# Patient Record
Sex: Female | Born: 1970 | Race: Black or African American | Hispanic: No | Marital: Married | Smoking: Former smoker
Health system: Southern US, Community
[De-identification: ages and names within clinical notes are randomized; demographics above are authoritative.]

## PROBLEM LIST (undated history)

## (undated) DIAGNOSIS — F329 Major depressive disorder, single episode, unspecified: Secondary | ICD-10-CM

## (undated) DIAGNOSIS — F419 Anxiety disorder, unspecified: Secondary | ICD-10-CM

## (undated) DIAGNOSIS — T7840XA Allergy, unspecified, initial encounter: Secondary | ICD-10-CM

## (undated) DIAGNOSIS — G43909 Migraine, unspecified, not intractable, without status migrainosus: Secondary | ICD-10-CM

## (undated) DIAGNOSIS — K589 Irritable bowel syndrome without diarrhea: Secondary | ICD-10-CM

## (undated) DIAGNOSIS — F32A Depression, unspecified: Secondary | ICD-10-CM

## (undated) HISTORY — DX: Allergy, unspecified, initial encounter: T78.40XA

## (undated) HISTORY — DX: Major depressive disorder, single episode, unspecified: F32.9

## (undated) HISTORY — DX: Depression, unspecified: F32.A

## (undated) HISTORY — DX: Irritable bowel syndrome, unspecified: K58.9

## (undated) HISTORY — PX: BUNIONECTOMY: SHX129

## (undated) HISTORY — DX: Migraine, unspecified, not intractable, without status migrainosus: G43.909

## (undated) HISTORY — DX: Anxiety disorder, unspecified: F41.9

## (undated) HISTORY — PX: NO PAST SURGERIES: SHX2092

---

## 1999-09-27 ENCOUNTER — Other Ambulatory Visit: Admission: RE | Admit: 1999-09-27 | Discharge: 1999-09-27 | Payer: Self-pay | Admitting: *Deleted

## 2000-10-01 ENCOUNTER — Other Ambulatory Visit: Admission: RE | Admit: 2000-10-01 | Discharge: 2000-10-01 | Payer: Self-pay | Admitting: *Deleted

## 2001-10-03 ENCOUNTER — Other Ambulatory Visit: Admission: RE | Admit: 2001-10-03 | Discharge: 2001-10-03 | Payer: Self-pay | Admitting: Obstetrics & Gynecology

## 2002-10-06 ENCOUNTER — Other Ambulatory Visit: Admission: RE | Admit: 2002-10-06 | Discharge: 2002-10-06 | Payer: Self-pay | Admitting: Obstetrics & Gynecology

## 2003-03-29 ENCOUNTER — Other Ambulatory Visit (HOSPITAL_COMMUNITY): Admission: RE | Admit: 2003-03-29 | Discharge: 2003-04-08 | Payer: Self-pay | Admitting: Psychiatry

## 2003-05-06 ENCOUNTER — Emergency Department (HOSPITAL_COMMUNITY): Admission: EM | Admit: 2003-05-06 | Discharge: 2003-05-07 | Payer: Self-pay | Admitting: Emergency Medicine

## 2003-10-14 ENCOUNTER — Other Ambulatory Visit: Admission: RE | Admit: 2003-10-14 | Discharge: 2003-10-14 | Payer: Self-pay | Admitting: Obstetrics & Gynecology

## 2003-10-23 ENCOUNTER — Emergency Department (HOSPITAL_COMMUNITY): Admission: EM | Admit: 2003-10-23 | Discharge: 2003-10-23 | Payer: Self-pay | Admitting: Emergency Medicine

## 2004-10-24 ENCOUNTER — Other Ambulatory Visit: Admission: RE | Admit: 2004-10-24 | Discharge: 2004-10-24 | Payer: Self-pay | Admitting: Obstetrics and Gynecology

## 2005-02-16 ENCOUNTER — Ambulatory Visit: Payer: Self-pay | Admitting: Internal Medicine

## 2010-02-14 ENCOUNTER — Encounter: Admission: RE | Admit: 2010-02-14 | Discharge: 2010-02-14 | Payer: Self-pay | Admitting: Obstetrics and Gynecology

## 2011-04-19 ENCOUNTER — Emergency Department (HOSPITAL_COMMUNITY)
Admission: EM | Admit: 2011-04-19 | Discharge: 2011-04-19 | Disposition: A | Discharge status: Home / Self Care | Payer: 59 | Attending: Emergency Medicine | Admitting: Emergency Medicine

## 2011-04-19 ENCOUNTER — Emergency Department (HOSPITAL_COMMUNITY): Payer: 59

## 2011-04-19 DIAGNOSIS — N200 Calculus of kidney: Secondary | ICD-10-CM | POA: Insufficient documentation

## 2011-04-19 DIAGNOSIS — R300 Dysuria: Secondary | ICD-10-CM | POA: Insufficient documentation

## 2011-04-19 DIAGNOSIS — F3289 Other specified depressive episodes: Secondary | ICD-10-CM | POA: Insufficient documentation

## 2011-04-19 DIAGNOSIS — Z79899 Other long term (current) drug therapy: Secondary | ICD-10-CM | POA: Insufficient documentation

## 2011-04-19 DIAGNOSIS — R112 Nausea with vomiting, unspecified: Secondary | ICD-10-CM | POA: Insufficient documentation

## 2011-04-19 DIAGNOSIS — R109 Unspecified abdominal pain: Secondary | ICD-10-CM | POA: Insufficient documentation

## 2011-04-19 DIAGNOSIS — F329 Major depressive disorder, single episode, unspecified: Secondary | ICD-10-CM | POA: Insufficient documentation

## 2011-04-19 LAB — URINALYSIS, ROUTINE W REFLEX MICROSCOPIC
Bilirubin Urine: NEGATIVE
Ketones, ur: NEGATIVE mg/dL
Protein, ur: NEGATIVE mg/dL
Specific Gravity, Urine: 1.023 (ref 1.005–1.030)
Urobilinogen, UA: 0.2 mg/dL (ref 0.0–1.0)
pH: 5.5 (ref 5.0–8.0)

## 2011-04-19 LAB — POCT PREGNANCY, URINE: Preg Test, Ur: NEGATIVE

## 2011-04-19 LAB — URINE MICROSCOPIC-ADD ON

## 2011-08-01 ENCOUNTER — Ambulatory Visit (INDEPENDENT_AMBULATORY_CARE_PROVIDER_SITE_OTHER): Payer: 59 | Admitting: Family Medicine

## 2011-08-01 VITALS — BP 106/68 | HR 72 | Temp 98.8°F | Resp 16 | Ht 64.0 in | Wt 262.0 lb

## 2011-08-01 DIAGNOSIS — J019 Acute sinusitis, unspecified: Secondary | ICD-10-CM

## 2011-08-01 DIAGNOSIS — J020 Streptococcal pharyngitis: Secondary | ICD-10-CM

## 2011-08-01 DIAGNOSIS — J343 Hypertrophy of nasal turbinates: Secondary | ICD-10-CM

## 2011-08-01 DIAGNOSIS — R059 Cough, unspecified: Secondary | ICD-10-CM

## 2011-08-01 DIAGNOSIS — R05 Cough: Secondary | ICD-10-CM

## 2011-08-01 DIAGNOSIS — R5381 Other malaise: Secondary | ICD-10-CM

## 2011-08-01 MED ORDER — HYDROCODONE-HOMATROPINE 5-1.5 MG/5ML PO SYRP: 5.0000 mL | ORAL_SOLUTION | Freq: Four times a day (QID) | ORAL | Status: AC | PRN

## 2011-08-01 MED ORDER — AZITHROMYCIN 250 MG PO TABS: ORAL_TABLET | ORAL | Status: AC

## 2011-08-01 NOTE — Progress Notes (Signed)
This 41 year old woman presents with 3 days of progressive sinus congestion, cough, sore throat. Symptoms are worsening although she has not had a fever.  Object: Patient is in no acute distress vital signs are stable.  HEENT: Negative with exception mucopurulent discharge from the nose.  Neck: Supple no adenopathy no thyromegaly.  Lungs: Clear to auscultation.  Assessment: Acute sinusitis and upper respiratory symptoms.

## 2012-02-22 ENCOUNTER — Emergency Department (HOSPITAL_COMMUNITY)
Admission: EM | Admit: 2012-02-22 | Discharge: 2012-02-23 | Disposition: A | Discharge status: Home / Self Care | Payer: 59 | Attending: Emergency Medicine | Admitting: Emergency Medicine

## 2012-02-22 ENCOUNTER — Encounter (HOSPITAL_COMMUNITY): Payer: Self-pay | Admitting: *Deleted

## 2012-02-22 DIAGNOSIS — IMO0001 Reserved for inherently not codable concepts without codable children: Secondary | ICD-10-CM | POA: Insufficient documentation

## 2012-02-22 DIAGNOSIS — X58XXXA Exposure to other specified factors, initial encounter: Secondary | ICD-10-CM | POA: Insufficient documentation

## 2012-02-22 DIAGNOSIS — S161XXA Strain of muscle, fascia and tendon at neck level, initial encounter: Secondary | ICD-10-CM

## 2012-02-22 DIAGNOSIS — S139XXA Sprain of joints and ligaments of unspecified parts of neck, initial encounter: Secondary | ICD-10-CM | POA: Insufficient documentation

## 2012-02-22 DIAGNOSIS — M791 Myalgia, unspecified site: Secondary | ICD-10-CM

## 2012-02-22 LAB — BASIC METABOLIC PANEL
BUN: 11 mg/dL (ref 6–23)
Calcium: 10 mg/dL (ref 8.4–10.5)
Creatinine, Ser: 0.84 mg/dL (ref 0.50–1.10)
GFR calc Af Amer: >90 mL/min (ref >90)
Glucose, Bld: 81 mg/dL (ref 70–99)

## 2012-02-22 LAB — CBC WITH DIFFERENTIAL/PLATELET
Basophils Absolute: 0.1 10*3/uL (ref 0.0–0.1)
Eosinophils Absolute: 0.3 10*3/uL (ref 0.0–0.7)
HCT: 40.3 % (ref 36.0–46.0)
MCH: 26.4 pg (ref 26.0–34.0)
Neutrophils Relative %: 48 % (ref 43–77)
RBC: 5.04 MIL/uL (ref 3.87–5.11)

## 2012-02-22 LAB — D-DIMER, QUANTITATIVE: D-Dimer, Quant: <0.22 ug/mL-FEU (ref 0.00–0.48)

## 2012-02-22 MED ORDER — ASPIRIN 81 MG PO CHEW
324.0000 mg | CHEWABLE_TABLET | Freq: Once | ORAL | Status: AC
  Administered 2012-02-22: 324 mg via ORAL
  Filled 2012-02-22: qty 4

## 2012-02-22 NOTE — ED Notes (Signed)
Pt c/o neck pain radiating to upper back; feels like muscle spasms.  States pain in right arm and some left chest wall pain increasing over past three days

## 2012-02-22 NOTE — ED Provider Notes (Addendum)
History     CSN: [id number]  Arrival date & time 02/22/12  3086   First MD Initiated Contact with Patient 02/22/12 2222      Chief Complaint  Patient presents with  . Spasms    (Consider location/radiation/quality/duration/timing/severity/associated sxs/prior treatment) HPI Comments: Patient states, that for the past, week.  She's had neck pain, worse on the left than the right that waxed and waned in intensity.  She has not taken any over-the-counter medication.  Tonight.  She states, that she developed right arm and tingling, and left chest pain, like something was sitting on her chest.  She does report that she recently traveled to [place] by plain.  She does take hormones for birth control.  She denies any swelling in her legs or shortness of breath.   The history is provided by the patient.    History reviewed. No pertinent past medical history.  History reviewed. No pertinent past surgical history.  No family history on file.  History  Substance Use Topics  . Smoking status: Former Games developer  . Smokeless tobacco: Not on file  . Alcohol Use: No    OB History    Grav Para Term Preterm Abortions TAB SAB Ect Mult Living                  Review of Systems  Constitutional: Negative for fever and chills.  HENT: Negative for congestion.   Respiratory: Negative for shortness of breath.   Cardiovascular: Positive for chest pain. Negative for leg swelling.  Musculoskeletal: Positive for myalgias.  Neurological: Negative for dizziness and weakness.    Allergies  Review of patient's allergies indicates no known allergies.  Home Medications   Current Outpatient Rx  Name Route Sig Dispense Refill  . FLUOXETINE HCL 10 MG PO CAPS Oral Take 10 mg by mouth daily.    Janetta Hora ESTRADIOL 0.5-2.5 MG-MCG PO TABS Oral Take 1 tablet by mouth daily.    . CYCLOBENZAPRINE HCL 10 MG PO TABS Oral Take 0.5 tablets (5 mg total) by mouth 3 (three) times daily as needed for muscle  spasms. 30 tablet 0  . IBUPROFEN 600 MG PO TABS Oral Take 1 tablet (600 mg total) by mouth every 6 (six) hours as needed for pain. 30 tablet 0    BP 116/65  Pulse 80  Temp 98.2 F (36.8 C) (Oral)  Resp 15  SpO2 98%  Physical Exam  Constitutional: She is oriented to person, place, and time. She appears well-developed and well-nourished.  HENT:  Head: Normocephalic.  Eyes: Pupils are equal, round, and reactive to light.  Neck: Normal range of motion.    Cardiovascular: Normal rate.   Pulmonary/Chest: Effort normal and breath sounds normal. No respiratory distress. She has no wheezes. She exhibits no tenderness.  Abdominal: Soft. She exhibits no distension. There is no tenderness.  Musculoskeletal: Normal range of motion. She exhibits no edema and no tenderness.  Neurological: She is alert and oriented to person, place, and time.  Skin: Skin is warm.    ED Course  Procedures (including critical care time)  Labs Reviewed  BASIC METABOLIC PANEL - Abnormal; Notable for the following:    GFR calc non Af Amer 84 (*)     All other components within normal limits  CBC WITH DIFFERENTIAL  POCT I-STAT TROPONIN I  D-DIMER, QUANTITATIVE   No results found.   1. Cervical strain   2. Muscle ache     ED ECG REPORT   Date:  04/10/2012  EKG Time: 9:51 AM  Rate 79  Rhythm: normal sinus rhythm,  normal EKG, normal sinus rhythm  Axis: normal  Intervals:normal  ST&T Change: none  Narrative Interpretation: normal            MDM   Check labs, EKG, chest x-ray, and d-dimer, although I don't, think PE is high on the differential due to her change in pain, change in location, and recent travel, will check        Arman Filter, NP 02/23/12 2021  Arman Filter, NP 04/10/12 [phone]

## 2012-02-23 MED ORDER — KETOROLAC TROMETHAMINE 30 MG/ML IJ SOLN: 30.0000 mg | Freq: Once | INTRAMUSCULAR | Status: DC

## 2012-02-23 MED ORDER — KETOROLAC TROMETHAMINE 30 MG/ML IJ SOLN
30.0000 mg | Freq: Once | INTRAMUSCULAR | Status: AC
  Administered 2012-02-23: 30 mg via INTRAMUSCULAR
  Filled 2012-02-23: qty 1

## 2012-02-23 MED ORDER — IBUPROFEN 600 MG PO TABS: 600.0000 mg | ORAL_TABLET | Freq: Four times a day (QID) | ORAL | Status: AC | PRN

## 2012-02-23 MED ORDER — CYCLOBENZAPRINE HCL 10 MG PO TABS
5.0000 mg | ORAL_TABLET | Freq: Three times a day (TID) | ORAL | Status: AC | PRN
Start: 2012-02-23 — End: 2012-03-04

## 2012-02-24 NOTE — ED Provider Notes (Signed)
Medical screening examination/treatment/procedure(s) were conducted as a shared visit with non-physician practitioner(s) and myself.  I personally evaluated the patient during the encounter  Addisen Chappelle T Sherrell Farish, MD 02/24/12 2326 

## 2012-04-13 NOTE — ED Provider Notes (Signed)
Medical screening examination/treatment/procedure(s) were performed by non-physician practitioner and as supervising physician I was immediately available for consultation/collaboration.  Toy Baker, MD 04/13/12 1019

## 2012-07-24 ENCOUNTER — Ambulatory Visit (INDEPENDENT_AMBULATORY_CARE_PROVIDER_SITE_OTHER): Payer: 59 | Admitting: Family Medicine

## 2012-07-24 VITALS — BP 103/64 | HR 86 | Temp 99.5°F | Resp 16 | Ht 66.0 in | Wt 278.4 lb

## 2012-07-24 DIAGNOSIS — J069 Acute upper respiratory infection, unspecified: Secondary | ICD-10-CM

## 2012-07-24 MED ORDER — PSEUDOEPHEDRINE HCL ER 120 MG PO TB12: 120.0000 mg | ORAL_TABLET | Freq: Two times a day (BID) | ORAL | Status: DC

## 2012-07-24 MED ORDER — MUCINEX DM MAXIMUM STRENGTH 60-1200 MG PO TB12: 1.0000 | ORAL_TABLET | Freq: Two times a day (BID) | ORAL | Status: DC

## 2012-07-24 MED ORDER — HYDROCOD POLST-CHLORPHEN POLST 10-8 MG/5ML PO LQCR: 5.0000 mL | Freq: Two times a day (BID) | ORAL | Status: DC | PRN

## 2012-07-24 MED ORDER — IPRATROPIUM BROMIDE 0.03 % NA SOLN: 2.0000 | Freq: Two times a day (BID) | NASAL | Status: DC

## 2012-07-24 NOTE — Progress Notes (Signed)
Subjective:    Patient ID: Kathy Carlson, female    DOB: 1970-07-03, 42 y.o.   MRN: [id number]  HPI  Monday started coughing and yesterday started w/ HA, throat hurting, today continuing, w/ fatigue, sneezing, blowing nose.  No f/c.   Coughing occ producitve of mucous, a lot of pnd. No ear pain, sinus pressure, or teeth pain. Nml GI/GU.  Sleeping some but cough is keeping her up. Some people at work have had the flu and pt did not get flu shot this yr. No sig arthralgias/myalgias. Tried some allegra and something else w/ no sig relief.  Past Medical History  Diagnosis Date  . Migraine   . Depression    Current Outpatient Prescriptions on File Prior to Visit  Medication Sig Dispense Refill  . FLUoxetine (PROZAC) 10 MG capsule Take 10 mg by mouth daily.      . norethindrone-ethinyl estradiol (FEMHRT LOW DOSE) 0.5-2.5 MG-MCG per tablet Take 1 tablet by mouth daily.      . promethazine (PHENERGAN) 25 MG tablet Take 25 mg by mouth as needed.       No Known Allergies   Review of Systems  Constitutional: Positive for fatigue. Negative for fever, chills, diaphoresis, activity change and appetite change.  HENT: Positive for congestion, sore throat, rhinorrhea, sneezing and postnasal drip. Negative for ear pain, nosebleeds, trouble swallowing, neck pain, neck stiffness, dental problem, voice change, sinus pressure and ear discharge.   Eyes: Negative for discharge and itching.  Respiratory: Positive for cough. Negative for shortness of breath.   Cardiovascular: Negative for chest pain.  Gastrointestinal: Negative for nausea, vomiting, abdominal pain, diarrhea and constipation.  Genitourinary: Negative for dysuria, decreased urine volume and difficulty urinating.  Musculoskeletal: Negative for myalgias and arthralgias.  Skin: Negative for rash.  Neurological: Positive for headaches. Negative for dizziness and syncope.  Hematological: Negative for adenopathy.  Psychiatric/Behavioral: Positive  for sleep disturbance.      BP 103/64  Pulse 86  Temp 99.5 F (37.5 C) (Oral)  Resp 16  Ht 5\' 6"  (1.676 m)  Wt 278 lb 6.4 oz (126.281 kg)  BMI 44.93 kg/m2  SpO2 100% Objective:   Physical Exam  Constitutional: She is oriented to person, place, and time. She appears well-developed and well-nourished. No distress.  HENT:  Head: Normocephalic and atraumatic.  Right Ear: External ear and ear canal normal. Tympanic membrane is erythematous. Tympanic membrane is not retracted and not bulging.  Left Ear: Tympanic membrane, external ear and ear canal normal. Tympanic membrane is not retracted and not bulging.  Nose: Mucosal edema and rhinorrhea present.  Mouth/Throat: Uvula is midline, oropharynx is clear and moist and mucous membranes are normal. No oropharyngeal exudate.  Eyes: Conjunctivae normal are normal. Right eye exhibits no discharge. Left eye exhibits no discharge. No scleral icterus.  Neck: Normal range of motion. Neck supple.  Cardiovascular: Normal rate, regular rhythm, normal heart sounds and intact distal pulses.   Pulmonary/Chest: Effort normal and breath sounds normal.  Abdominal: She exhibits no mass.  Lymphadenopathy:       Head (right side): Submandibular adenopathy present.       Head (left side): Submandibular adenopathy present.    She has cervical adenopathy.       Right cervical: Superficial cervical adenopathy present.       Right: No supraclavicular adenopathy present.       Left: No supraclavicular adenopathy present.  Neurological: She is alert and oriented to person, place, and time.  Skin: Skin is  warm and dry. She is not diaphoretic. No erythema.  Psychiatric: She has a normal mood and affect. Her behavior is normal.      Assessment & Plan:   1. URI (upper respiratory infection)  chlorpheniramine-HYDROcodone (TUSSIONEX PENNKINETIC ER) 10-8 MG/5ML LQCR, ipratropium (ATROVENT) 0.03 % nasal spray, Dextromethorphan-Guaifenesin (MUCINEX DM MAXIMUM STRENGTH)  60-1200 MG TB12, pseudoephedrine (SUDAFED 12 HOUR) 120 MG 12 hr tablet  Rec rest and symptomatic care. RTC if worsening Meds ordered this encounter  Medications  .       .       . chlorpheniramine-HYDROcodone (TUSSIONEX PENNKINETIC ER) 10-8 MG/5ML LQCR    Sig: Take 5 mLs by mouth every 12 (twelve) hours as needed (cough).    Dispense:  120 mL    Refill:  0  . ipratropium (ATROVENT) 0.03 % nasal spray    Sig: Place 2 sprays into the nose 2 (two) times daily.    Dispense:  30 mL    Refill:  1  . Dextromethorphan-Guaifenesin (MUCINEX DM MAXIMUM STRENGTH) 60-1200 MG TB12    Sig: Take 1 tablet by mouth every 12 (twelve) hours.    Dispense:  28 each    Refill:  0  . pseudoephedrine (SUDAFED 12 HOUR) 120 MG 12 hr tablet    Sig: Take 1 tablet (120 mg total) by mouth every 12 (twelve) hours.    Dispense:  14 tablet    Refill:  0   I recommend frequent warm salt water gargles, hot tea with honey and lemon, rest, and handwashing.  Hot showers or breathing in steam may help loosen the congestion.  Try a netti pot or sinus rinse is also likely to help you feel better and keep this from progressing.

## 2012-07-24 NOTE — Patient Instructions (Addendum)
Sinusitis Sinusitis is redness, soreness, and swelling (inflammation) of the paranasal sinuses. Paranasal sinuses are air pockets within the bones of your face (beneath the eyes, the middle of the forehead, or above the eyes). In healthy paranasal sinuses, mucus is able to drain out, and air is able to circulate through them by way of your nose. However, when your paranasal sinuses are inflamed, mucus and air can become trapped. This can allow bacteria and other germs to grow and cause infection. Sinusitis can develop quickly and last only a short time (acute) or continue over a long period (chronic). Sinusitis that lasts for more than 12 weeks is considered chronic.  CAUSES  Causes of sinusitis include:  Allergies.  Structural abnormalities, such as displacement of the cartilage that separates your nostrils (deviated septum), which can decrease the air flow through your nose and sinuses and affect sinus drainage.  Functional abnormalities, such as when the small hairs (cilia) that line your sinuses and help remove mucus do not work properly or are not present. SYMPTOMS  Symptoms of acute and chronic sinusitis are the same. The primary symptoms are pain and pressure around the affected sinuses. Other symptoms include:  Upper toothache.  Earache.  Headache.  Bad breath.  Decreased sense of smell and taste.  A cough, which worsens when you are lying flat.  Fatigue.  Fever.  Thick drainage from your nose, which often is green and may contain pus (purulent).  Swelling and warmth over the affected sinuses. DIAGNOSIS  Your caregiver will perform a physical exam. During the exam, your caregiver may:  Look in your nose for signs of abnormal growths in your nostrils (nasal polyps).  Tap over the affected sinus to check for signs of infection.  View the inside of your sinuses (endoscopy) with a special imaging device with a light attached (endoscope), which is inserted into your  sinuses. If your caregiver suspects that you have chronic sinusitis, one or more of the following tests may be recommended:  Allergy tests.  Nasal culture A sample of mucus is taken from your nose and sent to a lab and screened for bacteria.  Nasal cytology A sample of mucus is taken from your nose and examined by your caregiver to determine if your sinusitis is related to an allergy. TREATMENT  Most cases of acute sinusitis are related to a viral infection and will resolve on their own within 10 days. Sometimes medicines are prescribed to help relieve symptoms (pain medicine, decongestants, nasal steroid sprays, or saline sprays).  However, for sinusitis related to a bacterial infection, your caregiver will prescribe antibiotic medicines. These are medicines that will help kill the bacteria causing the infection.  Rarely, sinusitis is caused by a fungal infection. In theses cases, your caregiver will prescribe antifungal medicine. For some cases of chronic sinusitis, surgery is needed. Generally, these are cases in which sinusitis recurs more than 3 times per year, despite other treatments. HOME CARE INSTRUCTIONS   Drink plenty of water. Water helps thin the mucus so your sinuses can drain more easily.  Use a humidifier.  Inhale steam 3 to 4 times a day (for example, sit in the bathroom with the shower running).  Apply a warm, moist washcloth to your face 3 to 4 times a day, or as directed by your caregiver.  Use saline nasal sprays to help moisten and clean your sinuses.  Take over-the-counter or prescription medicines for pain, discomfort, or fever only as directed by your caregiver. SEEK IMMEDIATE MEDICAL   CARE IF:  You have increasing pain or severe headaches.  You have nausea, vomiting, or drowsiness.  You have swelling around your face.  You have vision problems.  You have a stiff neck.  You have difficulty breathing. MAKE SURE YOU:   Understand these  instructions.  Will watch your condition.  Will get help right away if you are not doing well or get worse. Document Released: 06/11/2005 Document Revised: 09/03/2011 Document Reviewed: 06/26/2011 [facility] Patient Information 2013 [place], [place].   Upper Respiratory Infection, Adult An upper respiratory infection (URI) is also known as the common cold. It is often caused by a type of germ (virus). Colds are easily spread (contagious). You can pass it to others by kissing, coughing, sneezing, or drinking out of the same glass. Usually, you get better in 1 or 2 weeks.  HOME CARE   Only take medicine as told by your doctor.  Use a warm mist humidifier or breathe in steam from a hot shower.  Drink enough water and fluids to keep your pee (urine) clear or pale yellow.  Get plenty of rest.  Return to work when your temperature is back to normal or as told by your doctor. You may use a face mask and wash your hands to stop your cold from spreading. GET HELP RIGHT AWAY IF:   After the first few days, you feel you are getting worse.  You have questions about your medicine.  You have chills, shortness of breath, or brown or red spit (mucus).  You have yellow or brown snot (nasal discharge) or pain in the face, especially when you bend forward.  You have a fever, puffy (swollen) neck, pain when you swallow, or white spots in the back of your throat.  You have a bad headache, ear pain, sinus pain, or chest pain.  You have a high-pitched whistling sound when you breathe in and out (wheezing).  You have a lasting cough or cough up blood.  You have sore muscles or a stiff neck. MAKE SURE YOU:   Understand these instructions.  Will watch your condition.  Will get help right away if you are not doing well or get worse. Document Released: 11/28/2007 Document Revised: 09/03/2011 Document Reviewed: 10/16/2010 [facility] Patient Information 2013 [place], [place].

## 2012-12-09 ENCOUNTER — Other Ambulatory Visit: Payer: Self-pay | Admitting: Obstetrics and Gynecology

## 2012-12-09 DIAGNOSIS — R928 Other abnormal and inconclusive findings on diagnostic imaging of breast: Secondary | ICD-10-CM

## 2012-12-17 ENCOUNTER — Ambulatory Visit
Admission: RE | Admit: 2012-12-17 | Discharge: 2012-12-17 | Disposition: A | Discharge status: Home / Self Care | Payer: 59 | Source: Ambulatory Visit | Attending: Obstetrics and Gynecology | Admitting: Obstetrics and Gynecology

## 2012-12-17 DIAGNOSIS — R928 Other abnormal and inconclusive findings on diagnostic imaging of breast: Secondary | ICD-10-CM

## 2012-12-19 ENCOUNTER — Ambulatory Visit (INDEPENDENT_AMBULATORY_CARE_PROVIDER_SITE_OTHER): Payer: 59 | Admitting: Physician Assistant

## 2012-12-19 VITALS — BP 134/82 | HR 67 | Temp 98.8°F | Resp 16 | Ht 65.0 in | Wt 266.8 lb

## 2012-12-19 DIAGNOSIS — IMO0002 Reserved for concepts with insufficient information to code with codable children: Secondary | ICD-10-CM

## 2012-12-19 DIAGNOSIS — L03012 Cellulitis of left finger: Secondary | ICD-10-CM

## 2012-12-19 MED ORDER — DOXYCYCLINE HYCLATE 100 MG PO CAPS: 100.0000 mg | ORAL_CAPSULE | Freq: Two times a day (BID) | ORAL | Status: DC

## 2012-12-19 NOTE — Progress Notes (Signed)
  Subjective:    Patient ID: Kathy Carlson, female    DOB: 07-24-1970, 42 y.o.   MRN: [id number]  HPI 42 year old female presents with 1 week history of left thumb lateral nail fold redness and pain.  States symptoms have been slowly worsening and becoming more painful so he decided to come in for evaluation.  Denies drainage from the area. No known trauma or hangnail but she has been clipping back the skin since the pain started.  Hit finger yesterday and noticed a bit of blood but no purulence.  No fevers, chills, nausea, or vomiting.  She does admit that the area feels warm and is throbbing slightly.   Patient otherwise doing well with no other concerns today.     Review of Systems  Constitutional: Negative for fever and chills.  Gastrointestinal: Negative for nausea and vomiting.  Skin: Positive for color change.  Neurological: Negative for headaches.       Objective:   Physical Exam  Constitutional: She is oriented to person, place, and time. She appears well-developed and well-nourished.  HENT:  Head: Normocephalic and atraumatic.  Right Ear: External ear normal.  Left Ear: External ear normal.  Eyes: Conjunctivae are normal.  Neck: Normal range of motion.  Cardiovascular: Normal rate.   Pulmonary/Chest: Effort normal.  Neurological: She is alert and oriented to person, place, and time.  Skin:  Left thumb lateral nail fold slightly erythematous and indurated. No fluctuance or purulent drainage.   Psychiatric: She has a normal mood and affect. Her behavior is normal. Judgment and thought content normal.          Assessment & Plan:  Paronychia, left - Plan: doxycycline (VIBRAMYCIN) 100 MG capsule  Early paronychia - no I&D necessary today Recommend warm, soapy soaks 2-3 times daily  Start doxycycline 100 mg bid x 10 days RTC if area becoming more red or fluctuant or if she develops fever or chills

## 2013-01-05 ENCOUNTER — Ambulatory Visit (INDEPENDENT_AMBULATORY_CARE_PROVIDER_SITE_OTHER): Payer: 59 | Admitting: Emergency Medicine

## 2013-01-05 VITALS — BP 124/80 | HR 82 | Temp 98.4°F | Resp 16 | Ht 64.5 in | Wt 285.2 lb

## 2013-01-05 DIAGNOSIS — J018 Other acute sinusitis: Secondary | ICD-10-CM

## 2013-01-05 DIAGNOSIS — J209 Acute bronchitis, unspecified: Secondary | ICD-10-CM

## 2013-01-05 MED ORDER — GUAIFENESIN-DM 100-10 MG/5ML PO SYRP: 5.0000 mL | ORAL_SOLUTION | ORAL | Status: DC | PRN

## 2013-01-05 MED ORDER — PSEUDOEPHEDRINE-GUAIFENESIN ER 60-600 MG PO TB12: 1.0000 | ORAL_TABLET | Freq: Two times a day (BID) | ORAL | Status: AC

## 2013-01-05 MED ORDER — AMOXICILLIN-POT CLAVULANATE 875-125 MG PO TABS: 1.0000 | ORAL_TABLET | Freq: Two times a day (BID) | ORAL | Status: DC

## 2013-01-05 NOTE — Progress Notes (Signed)
[facility] [address] [place] [address] [phone]  Date:  01/05/2013   Name:  Kathy Carlson   DOB:  [birth year]   MRN:  [id number]  PCP:  No PCP Per Patient    Chief Complaint: Cough and Sore Throat   History of Present Illness:  Kathy Carlson is a 42 y.o. very pleasant female patient who presents with the following:  Ill for over a week with nasal congestion, achy feeling.   Has purulent nasal drainage and post nasal drip and a mostly non productive cough.  No fever or chills,  No wheezing or shortness of breath.  No nausea or vomiting.  No stool change.  History of seasonal allergic rhinitis.  No improvement with over the counter medications or other home remedies. Denies other complaint or health concern today.   There are no active problems to display for this patient.   Past Medical History  Diagnosis Date  . Migraine   . Depression     No past surgical history on file.  History  Substance Use Topics  . Smoking status: Former Games developer  . Smokeless tobacco: Not on file  . Alcohol Use: No    Family History  Problem Relation Age of Onset  . Thyroid disease Mother   . Cancer Maternal Grandmother   . Hypertension Maternal Grandmother   . Thyroid disease Sister   . Hypertension Maternal Grandfather     No Known Allergies  Medication list has been reviewed and updated.  Current Outpatient Prescriptions on File Prior to Visit  Medication Sig Dispense Refill  . ketoprofen (ORUDIS) 75 MG capsule Take 75 mg by mouth as needed.      . norethindrone-ethinyl estradiol (FEMHRT LOW DOSE) 0.5-2.5 MG-MCG per tablet Take 1 tablet by mouth daily.      Marland Kitchen doxycycline (VIBRAMYCIN) 100 MG capsule Take 1 capsule (100 mg total) by mouth 2 (two) times daily.  20 capsule  0   No current facility-administered medications on file prior to visit.    Review of Systems:  As per HPI, otherwise negative.    Physical Examination: Filed Vitals:   01/05/13 1826  BP: 124/80  Pulse: 82  Temp: 98.4 F (36.9 C)  Resp: 16   Filed Vitals:   01/05/13 1826  Height: 5' 4.5" (1.638 m)  Weight: 285 lb 3.2 oz (129.366 kg)   Body mass index is 48.22 kg/(m^2). Ideal Body Weight: Weight in (lb) to have BMI = 25: 147.6  GEN: WDWN, NAD, Non-toxic, A & O x 3 HEENT: Atraumatic, Normocephalic. Neck supple. No masses, No LAD. Ears and Nose: No external deformity. CV: RRR, No M/G/R. No JVD. No thrill. No extra heart sounds. PULM: CTA B, no wheezes, crackles, rhonchi. No retractions. No resp. distress. No accessory muscle use. ABD: S, NT, ND, +BS. No rebound. No HSM. EXTR: No c/c/e NEURO Normal gait.  PSYCH: Normally interactive. Conversant. Not depressed or anxious appearing.  Calm demeanor.    Assessment and Plan: Sinusitis Bronchitis augmentin mucinex d Robitussin dm   Signed,  Phillips Odor, MD

## 2013-01-05 NOTE — Patient Instructions (Signed)

## 2013-08-21 ENCOUNTER — Other Ambulatory Visit: Payer: Self-pay | Admitting: Otolaryngology

## 2013-08-21 DIAGNOSIS — H9319 Tinnitus, unspecified ear: Secondary | ICD-10-CM

## 2013-09-01 ENCOUNTER — Other Ambulatory Visit: Payer: 59

## 2013-09-03 ENCOUNTER — Other Ambulatory Visit: Payer: 59

## 2013-09-08 ENCOUNTER — Ambulatory Visit
Admission: RE | Admit: 2013-09-08 | Discharge: 2013-09-08 | Disposition: A | Discharge status: Home / Self Care | Payer: 59 | Source: Ambulatory Visit | Attending: Otolaryngology | Admitting: Otolaryngology

## 2013-09-08 DIAGNOSIS — H9319 Tinnitus, unspecified ear: Secondary | ICD-10-CM

## 2013-09-11 ENCOUNTER — Telehealth (HOSPITAL_COMMUNITY): Payer: Self-pay | Admitting: Interventional Radiology

## 2013-09-11 ENCOUNTER — Other Ambulatory Visit (HOSPITAL_COMMUNITY): Payer: Self-pay | Admitting: Otolaryngology

## 2013-09-11 DIAGNOSIS — H9319 Tinnitus, unspecified ear: Secondary | ICD-10-CM

## 2013-09-11 NOTE — Telephone Encounter (Signed)
Called pt, left VM for her to call to set up consult with Deveshwar JM

## 2013-09-23 ENCOUNTER — Ambulatory Visit (HOSPITAL_COMMUNITY)
Admission: RE | Admit: 2013-09-23 | Discharge: 2013-09-23 | Disposition: A | Discharge status: Home / Self Care | Payer: 59 | Source: Ambulatory Visit | Attending: Otolaryngology | Admitting: Otolaryngology

## 2013-09-23 DIAGNOSIS — H9319 Tinnitus, unspecified ear: Secondary | ICD-10-CM

## 2014-10-05 ENCOUNTER — Ambulatory Visit (INDEPENDENT_AMBULATORY_CARE_PROVIDER_SITE_OTHER): Payer: 59

## 2014-10-05 ENCOUNTER — Ambulatory Visit (INDEPENDENT_AMBULATORY_CARE_PROVIDER_SITE_OTHER): Payer: 59 | Admitting: Emergency Medicine

## 2014-10-05 VITALS — BP 108/66 | HR 69 | Temp 98.0°F | Resp 16 | Ht 64.5 in | Wt 284.0 lb

## 2014-10-05 DIAGNOSIS — M5431 Sciatica, right side: Secondary | ICD-10-CM

## 2014-10-05 MED ORDER — NAPROXEN SODIUM 550 MG PO TABS: 550.0000 mg | ORAL_TABLET | Freq: Two times a day (BID) | ORAL | Status: DC

## 2014-10-05 MED ORDER — HYDROCODONE-ACETAMINOPHEN 5-325 MG PO TABS: 1.0000 | ORAL_TABLET | ORAL | Status: DC | PRN

## 2014-10-05 NOTE — Progress Notes (Deleted)
   Subjective:    Patient ID: Kathy Carlson, female    DOB: 12/23/1970, 44 y.o.   MRN: [id number]  HPI    Review of Systems     Objective:   Physical Exam        Assessment & Plan:

## 2014-10-05 NOTE — Patient Instructions (Signed)
Sciatica Sciatica is pain, weakness, numbness, or tingling along the path of the sciatic nerve. The nerve starts in the lower back and runs down the back of each leg. The nerve controls the muscles in the lower leg and in the back of the knee, while also providing sensation to the back of the thigh, lower leg, and the sole of your foot. Sciatica is a symptom of another medical condition. For instance, nerve damage or certain conditions, such as a herniated disk or bone spur on the spine, pinch or put pressure on the sciatic nerve. This causes the pain, weakness, or other sensations normally associated with sciatica. Generally, sciatica only affects one side of the body. CAUSES   Herniated or slipped disc.  Degenerative disk disease.  A pain disorder involving the narrow muscle in the buttocks (piriformis syndrome).  Pelvic injury or fracture.  Pregnancy.  Tumor (rare). SYMPTOMS  Symptoms can vary from mild to very severe. The symptoms usually travel from the low back to the buttocks and down the back of the leg. Symptoms can include:  Mild tingling or dull aches in the lower back, leg, or hip.  Numbness in the back of the calf or sole of the foot.  Burning sensations in the lower back, leg, or hip.  Sharp pains in the lower back, leg, or hip.  Leg weakness.  Severe back pain inhibiting movement. These symptoms may get worse with coughing, sneezing, laughing, or prolonged sitting or standing. Also, being overweight may worsen symptoms. DIAGNOSIS  Your caregiver will perform a physical exam to look for common symptoms of sciatica. He or she may ask you to do certain movements or activities that would trigger sciatic nerve pain. Other tests may be performed to find the cause of the sciatica. These may include:  Blood tests.  X-rays.  Imaging tests, such as an MRI or CT scan. TREATMENT  Treatment is directed at the cause of the sciatic pain. Sometimes, treatment is not necessary  and the pain and discomfort goes away on its own. If treatment is needed, your caregiver may suggest:  Over-the-counter medicines to relieve pain.  Prescription medicines, such as anti-inflammatory medicine, muscle relaxants, or narcotics.  Applying heat or ice to the painful area.  Steroid injections to lessen pain, irritation, and inflammation around the nerve.  Reducing activity during periods of pain.  Exercising and stretching to strengthen your abdomen and improve flexibility of your spine. Your caregiver may suggest losing weight if the extra weight makes the back pain worse.  Physical therapy.  Surgery to eliminate what is pressing or pinching the nerve, such as a bone spur or part of a herniated disk. HOME CARE INSTRUCTIONS   Only take over-the-counter or prescription medicines for pain or discomfort as directed by your caregiver.  Apply ice to the affected area for 20 minutes, 3-4 times a day for the first 48-72 hours. Then try heat in the same way.  Exercise, stretch, or perform your usual activities if these do not aggravate your pain.  Attend physical therapy sessions as directed by your caregiver.  Keep all follow-up appointments as directed by your caregiver.  Do not wear high heels or shoes that do not provide proper support.  Check your mattress to see if it is too soft. A firm mattress may lessen your pain and discomfort. SEEK IMMEDIATE MEDICAL CARE IF:   You lose control of your bowel or bladder (incontinence).  You have increasing weakness in the lower back, pelvis, buttocks,   or legs.  You have redness or swelling of your back.  You have a burning sensation when you urinate.  You have pain that gets worse when you lie down or awakens you at night.  Your pain is worse than you have experienced in the past.  Your pain is lasting longer than 4 weeks.  You are suddenly losing weight without reason. MAKE SURE YOU:  Understand these  instructions.  Will watch your condition.  Will get help right away if you are not doing well or get worse. Document Released: 06/05/2001 Document Revised: 12/11/2011 Document Reviewed: 10/21/2011 [employer] Patient Information 2015 [employer]. This information is not intended to replace advice given to you by your health care provider. Make sure you discuss any questions you have with your health care provider.  

## 2014-10-05 NOTE — Progress Notes (Signed)
[facility] [address], [place] [id number] [phone]  Date:  10/05/2014   Name:  Kathy Carlson   DOB:  05-05-1971   MRN:  [id number]  PCP:  No PCP Per Patient    Chief Complaint: Hip Pain   History of Present Illness:  Kathy Carlson is a 44 y.o. very pleasant female patient who presents with the following:  Patient was helping her husband move a mattress and lost her balance and fell balance and fell backward and fell to the ground This occurred in February. Since, she has used OTC medication with no meaningful change The pain is located in her right hip and radiates distally to the lateral thigh. Intermittently radiates to the lateral lower leg. No neuro symptoms. Never treated before today Says pain is not affected by posture, position or activity Pain only diminished by medication. No improvement with over the counter medications or other home remedies.  Denies other complaint or health concern today.   There are no active problems to display for this patient.   Past Medical History  Diagnosis Date  . Migraine   . Depression   . Allergy   . Anxiety     History reviewed. No pertinent past surgical history.  History  Substance Use Topics  . Smoking status: Former Games developermoker  . Smokeless tobacco: Not on file  . Alcohol Use: No    Family History  Problem Relation Age of Onset  . Thyroid disease Mother   . Cancer Maternal Grandmother   . Hypertension Maternal Grandmother   . Thyroid disease Sister   . Hypertension Maternal Grandfather   . Diabetes Father     No Known Allergies  Medication list has been reviewed and updated.  No current outpatient prescriptions on file prior to visit.   No current facility-administered medications on file prior to visit.    Review of Systems:  As per HPI, otherwise negative.    Physical Examination: Filed Vitals:   10/05/14 1425  BP: 108/66  Pulse: 69  Temp: 98 F (36.7 C)  Resp:  16   Filed Vitals:   10/05/14 1425  Height: 5' 4.5" (1.638 m)  Weight: 284 lb (128.822 kg)   Body mass index is 48.01 kg/(m^2). Ideal Body Weight: Weight in (lb) to have BMI = 25: 147.6   GEN: morbid obesity, NAD, Non-toxic, Alert & Oriented x 3 HEENT: Atraumatic, Normocephalic.  Ears and Nose: No external deformity. EXTR: No clubbing/cyanosis/edema NEURO: Normal gait.  Neuro and motor leg ntact PSYCH: Normally interactive. Conversant. Not depressed or anxious appearing.  Calm demeanor.  Back :  Tender right sacral region and sciatic notch  Assessment and Plan: Sciatic neuritis Anaprox Consider magnet in two weeks if not improved  Signed,  Phillips OdorJeffery Tej Murdaugh, MD   [facility] reading (PRIMARY) by  Dr. Dareen PianoAnderson.  unremarkable.

## 2014-10-25 ENCOUNTER — Ambulatory Visit (INDEPENDENT_AMBULATORY_CARE_PROVIDER_SITE_OTHER): Payer: 59 | Admitting: Emergency Medicine

## 2014-10-25 VITALS — BP 112/80 | HR 65 | Temp 98.3°F | Resp 20 | Ht 64.5 in | Wt 286.6 lb

## 2014-10-25 DIAGNOSIS — M5431 Sciatica, right side: Secondary | ICD-10-CM

## 2014-10-25 MED ORDER — LORAZEPAM 2 MG PO TABS: ORAL_TABLET | ORAL | Status: DC

## 2014-10-25 NOTE — Patient Instructions (Signed)
Sciatica Sciatica is pain, weakness, numbness, or tingling along the path of the sciatic nerve. The nerve starts in the lower back and runs down the back of each leg. The nerve controls the muscles in the lower leg and in the back of the knee, while also providing sensation to the back of the thigh, lower leg, and the sole of your foot. Sciatica is a symptom of another medical condition. For instance, nerve damage or certain conditions, such as a herniated disk or bone spur on the spine, pinch or put pressure on the sciatic nerve. This causes the pain, weakness, or other sensations normally associated with sciatica. Generally, sciatica only affects one side of the body. CAUSES   Herniated or slipped disc.  Degenerative disk disease.  A pain disorder involving the narrow muscle in the buttocks (piriformis syndrome).  Pelvic injury or fracture.  Pregnancy.  Tumor (rare). SYMPTOMS  Symptoms can vary from mild to very severe. The symptoms usually travel from the low back to the buttocks and down the back of the leg. Symptoms can include:  Mild tingling or dull aches in the lower back, leg, or hip.  Numbness in the back of the calf or sole of the foot.  Burning sensations in the lower back, leg, or hip.  Sharp pains in the lower back, leg, or hip.  Leg weakness.  Severe back pain inhibiting movement. These symptoms may get worse with coughing, sneezing, laughing, or prolonged sitting or standing. Also, being overweight may worsen symptoms. DIAGNOSIS  Your caregiver will perform a physical exam to look for common symptoms of sciatica. He or she may ask you to do certain movements or activities that would trigger sciatic nerve pain. Other tests may be performed to find the cause of the sciatica. These may include:  Blood tests.  X-rays.  Imaging tests, such as an MRI or CT scan. TREATMENT  Treatment is directed at the cause of the sciatic pain. Sometimes, treatment is not necessary  and the pain and discomfort goes away on its own. If treatment is needed, your caregiver may suggest:  Over-the-counter medicines to relieve pain.  Prescription medicines, such as anti-inflammatory medicine, muscle relaxants, or narcotics.  Applying heat or ice to the painful area.  Steroid injections to lessen pain, irritation, and inflammation around the nerve.  Reducing activity during periods of pain.  Exercising and stretching to strengthen your abdomen and improve flexibility of your spine. Your caregiver may suggest losing weight if the extra weight makes the back pain worse.  Physical therapy.  Surgery to eliminate what is pressing or pinching the nerve, such as a bone spur or part of a herniated disk. HOME CARE INSTRUCTIONS   Only take over-the-counter or prescription medicines for pain or discomfort as directed by your caregiver.  Apply ice to the affected area for 20 minutes, 3-4 times a day for the first 48-72 hours. Then try heat in the same way.  Exercise, stretch, or perform your usual activities if these do not aggravate your pain.  Attend physical therapy sessions as directed by your caregiver.  Keep all follow-up appointments as directed by your caregiver.  Do not wear high heels or shoes that do not provide proper support.  Check your mattress to see if it is too soft. A firm mattress may lessen your pain and discomfort. SEEK IMMEDIATE MEDICAL CARE IF:   You lose control of your bowel or bladder (incontinence).  You have increasing weakness in the lower back, pelvis, buttocks,   or legs.  You have redness or swelling of your back.  You have a burning sensation when you urinate.  You have pain that gets worse when you lie down or awakens you at night.  Your pain is worse than you have experienced in the past.  Your pain is lasting longer than 4 weeks.  You are suddenly losing weight without reason. MAKE SURE YOU:  Understand these  instructions.  Will watch your condition.  Will get help right away if you are not doing well or get worse. Document Released: 06/05/2001 Document Revised: 12/11/2011 Document Reviewed: 10/21/2011 [employer] Patient Information 2015 [employer]. This information is not intended to replace advice given to you by your health care provider. Make sure you discuss any questions you have with your health care provider.  

## 2014-10-25 NOTE — Progress Notes (Signed)
[facility] [address] [place] [address] [phone]  Date:  10/25/2014   Name:  Kathy Carlson   DOB:  [birth year]   MRN:  [id number]  PCP:  No PCP Per Patient    Chief Complaint: Follow-up and Leg Pain   History of Present Illness:  Kathy Carlson is a 44 y.o. very pleasant female patient who presents with the following:  Seen 4/12 following a fall.  Experienced low back pain following with radicular pain in right leg. Has interval continuation of pain with no weakness or paresthesias in leg No reinjury. No improvement with over the counter medications or other home remedies. Denies other complaint or health concern today.   There are no active problems to display for this patient.   Past Medical History  Diagnosis Date  . Migraine   . Depression   . Allergy   . Anxiety     History reviewed. No pertinent past surgical history.  History  Substance Use Topics  . Smoking status: Former Games developer  . Smokeless tobacco: Not on file  . Alcohol Use: No    Family History  Problem Relation Age of Onset  . Thyroid disease Mother   . Cancer Maternal Grandmother   . Hypertension Maternal Grandmother   . Thyroid disease Sister   . Hypertension Maternal Grandfather   . Diabetes Father     No Known Allergies  Medication list has been reviewed and updated.  Current Outpatient Prescriptions on File Prior to Visit  Medication Sig Dispense Refill  . FLUoxetine (PROZAC) 10 MG capsule Take 10 mg by mouth daily.    Marland Kitchen HYDROcodone-acetaminophen (NORCO) 5-325 MG per tablet Take 1-2 tablets by mouth every 4 (four) hours as needed. 30 tablet 0  . nabumetone (RELAFEN) 500 MG tablet Take 500 mg by mouth daily.    . naproxen sodium (ANAPROX DS) 550 MG tablet Take 1 tablet (550 mg total) by mouth 2 (two) times daily with a meal. 40 tablet 0  . promethazine (PHENERGAN) 25 MG tablet Take 25 mg by mouth every 6 (six) hours as needed for nausea or vomiting.      No current facility-administered medications on file prior to visit.    Review of Systems:  Review of Systems  Constitutional: Negative for fever, chills and fatigue.  HENT: Negative for congestion, ear pain, hearing loss, postnasal drip, rhinorrhea and sinus pressure.   Eyes: Negative for discharge and redness.  Respiratory: Negative for cough, shortness of breath and wheezing.   Cardiovascular: Negative for chest pain and leg swelling.  Gastrointestinal: Negative for nausea, vomiting, abdominal pain, constipation and blood in stool.  Genitourinary: Negative for dysuria, urgency and frequency.  Musculoskeletal: Negative for neck stiffness.  Skin: Negative for rash.  Neurological: Negative for seizures, weakness and headaches.     Physical Examination: Filed Vitals:   10/25/14 1946  BP: 112/80  Pulse: 65  Temp: 98.3 F (36.8 C)  Resp: 20   Filed Vitals:   10/25/14 1946  Height: 5' 4.5" (1.638 m)  Weight: 286 lb 9.6 oz (130.001 kg)   Body mass index is 48.45 kg/(m^2). Ideal Body Weight: Weight in (lb) to have BMI = 25: 147.6   GEN: WDWN, NAD, Non-toxic, Alert & Oriented x 3 HEENT: Atraumatic, Normocephalic.  Ears and Nose: No external deformity. EXTR: No clubbing/cyanosis/edema NEURO: Normal gait.  PSYCH: Normally interactive. Conversant. Not depressed or anxious appearing.  Calm demeanor.  BACK  Tender sciatic notch. Normal neuro exam  and motor   Assessment and Plan: Sciatic neuritis Continue meds MRI   Signed Phillips OdorJeffery Anderson, MD

## 2014-10-26 ENCOUNTER — Other Ambulatory Visit: Payer: Self-pay | Admitting: Emergency Medicine

## 2014-10-26 ENCOUNTER — Telehealth: Payer: Self-pay

## 2014-10-26 NOTE — Telephone Encounter (Signed)
Patient says Dr Dareen PianoAnderson forgot to call in her hydrocodone and naproxen rx from her most recent visit

## 2014-10-27 NOTE — Telephone Encounter (Signed)
Dr Dareen Pianoanderson: Please authorize if you want these filled.an

## 2014-10-28 NOTE — Telephone Encounter (Signed)
Pt is calling to check the status of rx request from 10/26/14 Please advise pt at [phone]

## 2014-10-28 NOTE — Telephone Encounter (Signed)
Notified pt that we sent in anaprox. She inquired about hydrocodone. I told her he didn't rx any of that. Pt said ok.

## 2014-11-10 ENCOUNTER — Other Ambulatory Visit: Payer: Self-pay

## 2015-03-18 ENCOUNTER — Ambulatory Visit (INDEPENDENT_AMBULATORY_CARE_PROVIDER_SITE_OTHER): Payer: 59 | Admitting: Emergency Medicine

## 2015-03-18 VITALS — BP 108/60 | HR 86 | Temp 98.3°F | Resp 16 | Ht 64.5 in | Wt 293.0 lb

## 2015-03-18 DIAGNOSIS — J301 Allergic rhinitis due to pollen: Secondary | ICD-10-CM

## 2015-03-18 MED ORDER — AZELASTINE HCL 0.1 % NA SOLN: 2.0000 | Freq: Two times a day (BID) | NASAL | Status: DC

## 2015-03-18 MED ORDER — BENZONATATE 100 MG PO CAPS: 100.0000 mg | ORAL_CAPSULE | Freq: Three times a day (TID) | ORAL | Status: DC | PRN

## 2015-03-18 NOTE — Patient Instructions (Signed)
Limit your time outside. Take Zyrtec 10 mg 1 a day. Take Benadryl 1-2 capsules at that time..Allergic Rhinitis Allergic rhinitis is when the mucous membranes in the nose respond to allergens. Allergens are particles in the air that cause your body to have an allergic reaction. This causes you to release allergic antibodies. Through a chain of events, these eventually cause you to release histamine into the blood stream. Although meant to protect the body, it is this release of histamine that causes your discomfort, such as frequent sneezing, congestion, and an itchy, runny nose.  CAUSES  Seasonal allergic rhinitis (hay fever) is caused by pollen allergens that may come from grasses, trees, and weeds. Year-round allergic rhinitis (perennial allergic rhinitis) is caused by allergens such as house dust mites, pet dander, and mold spores.  SYMPTOMS   Nasal stuffiness (congestion).  Itchy, runny nose with sneezing and tearing of the eyes. DIAGNOSIS  Your health care provider can help you determine the allergen or allergens that trigger your symptoms. If you and your health care provider are unable to determine the allergen, skin or blood testing may be used. TREATMENT  Allergic rhinitis does not have a cure, but it can be controlled by:  Medicines and allergy shots (immunotherapy).  Avoiding the allergen. Hay fever may often be treated with antihistamines in pill or nasal spray forms. Antihistamines block the effects of histamine. There are over-the-counter medicines that may help with nasal congestion and swelling around the eyes. Check with your health care provider before taking or giving this medicine.  If avoiding the allergen or the medicine prescribed do not work, there are many new medicines your health care provider can prescribe. Stronger medicine may be used if initial measures are ineffective. Desensitizing injections can be used if medicine and avoidance does not work. Desensitization is  when a patient is given ongoing shots until the body becomes less sensitive to the allergen. Make sure you follow up with your health care provider if problems continue. HOME CARE INSTRUCTIONS It is not possible to completely avoid allergens, but you can reduce your symptoms by taking steps to limit your exposure to them. It helps to know exactly what you are allergic to so that you can avoid your specific triggers. SEEK MEDICAL CARE IF:   You have a fever.  You develop a cough that does not stop easily (persistent).  You have shortness of breath.  You start wheezing.  Symptoms interfere with normal daily activities. Document Released: 03/06/2001 Document Revised: 06/16/2013 Document Reviewed: 02/16/2013 [facility] Patient Information 2015 [place], [place]. This information is not intended to replace advice given to you by your health care provider. Make sure you discuss any questions you have with your health care provider.

## 2015-03-18 NOTE — Progress Notes (Signed)
This chart was scribed for Kathy Chris, MD by Stann Ore, Medical Scribe. This patient was seen in Room 12 and the patient's care was started 1:26 PM.  Chief Complaint:  Chief Complaint  Patient presents with  . Sinusitis    x 1 week  . Cough    productive/ x 1 week    HPI: Kathy Carlson is a 44 y.o. female who reports to [facility] today complaining of sinusitis with productive cough that started a week ago.  She states that it started with an itchy throat, and some rhinorrhea. She took cough medication last night. She denies using nasal sprays currently. Earlier today, she was coughing a lot more, an episode of vomit and nose bleed.   In the past, she's tried zyrtec for mild relief. She's had these symptoms in the past.  She works as Teacher, early years/pre.   Past Medical History  Diagnosis Date  . Migraine   . Depression   . Allergy   . Anxiety    No past surgical history on file. Social History   Social History  . Marital Status: Married    Spouse Name: N/A  . Number of Children: N/A  . Years of Education: N/A   Social History Main Topics  . Smoking status: Former Games developer  . Smokeless tobacco: Not on file  . Alcohol Use: No  . Drug Use: No  . Sexual Activity: Yes   Other Topics Concern  . Not on file   Social History Narrative   Family History  Problem Relation Age of Onset  . Thyroid disease Mother   . Cancer Maternal Grandmother   . Hypertension Maternal Grandmother   . Thyroid disease Sister   . Hypertension Maternal Grandfather   . Diabetes Father    No Known Allergies Prior to Admission medications   Medication Sig Start Date End Date Taking? Authorizing Provider  FLUoxetine (PROZAC) 10 MG capsule Take 10 mg by mouth daily.   Yes Historical Provider, MD  nabumetone (RELAFEN) 500 MG tablet Take 500 mg by mouth daily.   Yes Historical Provider, MD  promethazine (PHENERGAN) 25 MG tablet Take 25 mg by mouth every 6 (six) hours as needed for nausea or  vomiting.   Yes Historical Provider, MD  HYDROcodone-acetaminophen (NORCO) 5-325 MG per tablet Take 1-2 tablets by mouth every 4 (four) hours as needed. Patient not taking: Reported on 03/18/2015 10/05/14   Kathy Dane, MD  LORazepam (ATIVAN) 2 MG tablet 2 tabs 30 minutes prior to MRI Patient not taking: Reported on 03/18/2015 10/25/14   Kathy Dane, MD  naproxen sodium (ANAPROX) 550 MG tablet TAKE ONE TABLET BY MOUTH TWICE DAILY WITH MEALS. Patient not taking: Reported on 03/18/2015 10/27/14   Kathy Jeffery, PA-C     ROS:  Constitutional: negative for chills, fever, night sweats, weight changes, or fatigue  HEENT: negative for vision changes, hearing loss, rhinorrhea, ST; positive for sinus pressure, epistaxis, rhinorrhea, congestion Cardiovascular: negative for chest pain or palpitations Respiratory: negative for hemoptysis, wheezing, shortness of breath; positive for cough Abdominal: negative for abdominal pain, nausea, vomiting, diarrhea, or constipation Dermatological: negative for rash Neurologic: negative for headache, dizziness, or syncope All other systems reviewed and are otherwise negative with the exception to those above and in the HPI.  PHYSICAL EXAM: Filed Vitals:   03/18/15 1251  BP: 108/60  Pulse: 86  Temp: 98.3 F (36.8 C)  Resp: 16   Body mass index is 49.53 kg/(m^2).   General:  Alert, no acute distress HEENT:  Normocephalic, atraumatic, oropharynx patent; Congested turbinates Eye: [facility], [facility] Cardiovascular:  Regular rate and rhythm, no rubs murmurs or gallops.  No Carotid bruits, radial pulse intact. No pedal edema.  Respiratory: Clear to auscultation bilaterally.  No wheezes, rales, or rhonchi.  No cyanosis, no use of accessory musculature Abdominal: No organomegaly, abdomen is soft and non-tender, positive bowel sounds. No masses. Musculoskeletal: Gait intact. No edema, tenderness Skin: No rashes. Neurologic: Facial musculature  symmetric. Psychiatric: Patient acts appropriately throughout our interaction.  Lymphatic: No cervical or submandibular lymphadenopathy Genitourinary/Anorectal: No acute findings Meds ordered this encounter  Medications  . azelastine (ASTELIN) 0.1 % nasal spray    Sig: Place 2 sprays into both nostrils 2 (two) times daily. Use in each nostril as directed    Dispense:  30 mL    Refill:  12  . benzonatate (TESSALON) 100 MG capsule    Sig: Take 1-2 capsules (100-200 mg total) by mouth 3 (three) times daily as needed for cough.    Dispense:  40 capsule    Refill:  0    LABS:    EKG/XRAY:   Primary read interpreted by Dr. Cleta Alberts at [facility].   ASSESSMENT/PLAN: We'll treat symptomatically with Astelin and Tessalon Perles. She will call with any worsening.  By signing my name below, I, Stann Ore, attest that this documentation has been prepared under the direction and in the presence of Kathy Chris, MD.I personally performed the services described in this documentation, which was scribed in my presence. The recorded information has been reviewed and is accurate. Electronically Signed: Stann Ore, Scribe. 03/18/2015 , 1:26 PM .    Gross sideeffects, risk and benefits, and alternatives of medications d/w patient. Patient is aware that all medications have potential sideeffects and we are unable to predict every sideeffect or drug-drug interaction that may occur.  Kathy Chris MD 03/18/2015 1:26 PM

## 2015-10-07 ENCOUNTER — Ambulatory Visit (INDEPENDENT_AMBULATORY_CARE_PROVIDER_SITE_OTHER): Payer: 59 | Admitting: Physician Assistant

## 2015-10-07 VITALS — BP 110/70 | HR 88 | Temp 98.1°F | Resp 18 | Ht 65.0 in | Wt 297.2 lb

## 2015-10-07 DIAGNOSIS — N898 Other specified noninflammatory disorders of vagina: Secondary | ICD-10-CM

## 2015-10-07 DIAGNOSIS — K589 Irritable bowel syndrome without diarrhea: Secondary | ICD-10-CM

## 2015-10-07 DIAGNOSIS — L298 Other pruritus: Secondary | ICD-10-CM | POA: Diagnosis not present

## 2015-10-07 DIAGNOSIS — G43909 Migraine, unspecified, not intractable, without status migrainosus: Secondary | ICD-10-CM | POA: Insufficient documentation

## 2015-10-07 DIAGNOSIS — Z9109 Other allergy status, other than to drugs and biological substances: Secondary | ICD-10-CM | POA: Insufficient documentation

## 2015-10-07 DIAGNOSIS — N9489 Other specified conditions associated with female genital organs and menstrual cycle: Secondary | ICD-10-CM | POA: Diagnosis not present

## 2015-10-07 LAB — POCT WET + KOH PREP
Trich by wet prep: ABSENT
Yeast by KOH: ABSENT
Yeast by wet prep: ABSENT

## 2015-10-07 LAB — POCT URINALYSIS DIP (MANUAL ENTRY)
BILIRUBIN UA: NEGATIVE
Bilirubin, UA: NEGATIVE
Blood, UA: NEGATIVE
GLUCOSE UA: NEGATIVE
Nitrite, UA: NEGATIVE
PROTEIN UA: NEGATIVE
Spec Grav, UA: 1.015
Urobilinogen, UA: 0.2
pH, UA: 7

## 2015-10-07 LAB — POC MICROSCOPIC URINALYSIS (UMFC): Mucus: ABSENT

## 2015-10-07 NOTE — Patient Instructions (Addendum)
There is no evidence of infection in either the urine or vaginal fluid. I recommend lots of liquids to drink, Going without underwear at night, avoiding tight-fitting clothing during the day. You may find that an OTC diaper ointment helps to soothe the area. If the symptoms worsen/persist, please return for re-evaluation.    IF you received an x-ray today, you will receive an invoice from [facility]. Please contact [facility] at [phone] with questions or concerns regarding your invoice.   IF you received labwork today, you will receive an invoice from [facility]. Please contact [facility] at [phone] with questions or concerns regarding your invoice.   Our billing staff will not be able to assist you with questions regarding bills from these companies.  You will be contacted with the lab results as soon as they are available. The fastest way to get your results is to activate your My Chart account. Instructions are located on the last page of this paperwork. If you have not heard from us regarding the results in 2 weeks, please contact this office.

## 2015-10-07 NOTE — Progress Notes (Signed)
Patient ID: Kathy Carlson, female    DOB: 07-04-1970, 45 y.o.   MRN: [id number]  PCP: No PCP Per Patient  Subjective:   Chief Complaint  Patient presents with  . Vaginal Itching    C/O itching & odor off & on since period ended a week or so ago.    HPI Presents for evaluation of vaginal itching and odor x several days, following the end of recent menstruation.  No atypical vaginal discharge. Sexually monogamous with her husband. No urinary symptoms. No fever, chills. No abdominal or back pain.    Review of Systems As above.    Patient Active Problem List   Diagnosis Date Noted  . Environmental allergies 10/07/2015  . Migraine headache 10/07/2015     Prior to Admission medications   Medication Sig Start Date End Date Taking? Authorizing Provider  azelastine (ASTELIN) 0.1 % nasal spray Place 2 sprays into both nostrils 2 (two) times daily. Use in each nostril as directed 03/18/15  Yes Collene Gobble, MD  nabumetone (RELAFEN) 500 MG tablet Take 500 mg by mouth daily.   Yes Historical Provider, MD  promethazine (PHENERGAN) 25 MG tablet Take 25 mg by mouth every 6 (six) hours as needed for nausea or vomiting.   Yes Historical Provider, MD     No Known Allergies     Objective:  Physical Exam  Constitutional: She is oriented to person, place, and time. She appears well-developed and well-nourished. She is active and cooperative. No distress.  BP 110/70 mmHg  Pulse 88  Temp(Src) 98.1 F (36.7 C) (Oral)  Resp 18  Ht  (1.651 m)  Wt 297 lb 4 oz (134.832 kg)  BMI 49.47 kg/m2  SpO2 93%  LMP 09/21/2015 (Exact Date)  HENT:  Head: Normocephalic and atraumatic.  Right Ear: Hearing normal.  Left Ear: Hearing normal.  Eyes: Conjunctivae are normal. No scleral icterus.  Neck: Normal range of motion. Neck supple. No thyromegaly present.  Cardiovascular: Normal rate, regular rhythm and normal heart sounds.   Pulses:      Radial pulses are 2+ on the right side, and 2+  on the left side.  Pulmonary/Chest: Effort normal and breath sounds normal.  Lymphadenopathy:       Head (right side): No tonsillar, no preauricular, no posterior auricular and no occipital adenopathy present.       Head (left side): No tonsillar, no preauricular, no posterior auricular and no occipital adenopathy present.    She has no cervical adenopathy.       Right: No supraclavicular adenopathy present.       Left: No supraclavicular adenopathy present.  Neurological: She is alert and oriented to person, place, and time. No sensory deficit.  Skin: Skin is warm, dry and intact. No rash noted. No cyanosis or erythema. Nails show no clubbing.  Psychiatric: She has a normal mood and affect. Her speech is normal and behavior is normal.       Results for orders placed or performed in visit on 10/07/15  POCT urinalysis dipstick  Result Value Ref Range   Color, UA yellow yellow   Clarity, UA clear clear   Glucose, UA negative negative   Bilirubin, UA negative negative   Ketones, POC UA negative negative   Spec Grav, UA 1.015    Blood, UA negative negative   pH, UA 7.0    Protein Ur, POC negative negative   Urobilinogen, UA 0.2    Nitrite, UA Negative Negative   Leukocytes,  UA Trace (A) Negative  POCT Microscopic Urinalysis ([facility])  Result Value Ref Range   WBC,UR,HPF,POC Few (A) None WBC/hpf   RBC,UR,HPF,POC None None RBC/hpf   Bacteria Few (A) None, Too numerous to count   Mucus Absent Absent   Epithelial Cells, UR Per Microscopy Few (A) None, Too numerous to count cells/hpf  POCT Wet + KOH Prep  Result Value Ref Range   Yeast by KOH Absent Present, Absent   Yeast by wet prep Absent Present, Absent   WBC by wet prep None None, Few, Too numerous to count   Clue Cells Wet Prep HPF POC None None, Too numerous to count   Trich by wet prep Absent Present, Absent   Bacteria Wet Prep HPF POC Moderate (A) None, Few, Too numerous to count   Epithelial Cells By [employer] Financial[employer]et Pref ([facility]) Few  None, Few, Too numerous to count   RBC,UR,HPF,POC None None RBC/hpf       Assessment & Plan:   1. Vaginal itching 2. Vaginal odor No evidence of infection. Recommend loose-fitting clothing, sleeping without underwear, cotton-crotch underwear. Consider OTC diaper ointment product to soothe the area externally. RTC if symptoms worsen/persist, of if she develops new symptoms. - POCT urinalysis dipstick - POCT Microscopic Urinalysis ([facility]) - POCT Wet + KOH Prep    Fernande Brashelle S. Shamell Hittle, PA-C Physician Assistant-Certified [facility]

## 2016-01-02 ENCOUNTER — Other Ambulatory Visit: Payer: Self-pay | Admitting: Orthopedic Surgery

## 2016-01-25 ENCOUNTER — Encounter (HOSPITAL_BASED_OUTPATIENT_CLINIC_OR_DEPARTMENT_OTHER): Payer: Self-pay | Admitting: *Deleted

## 2016-01-26 NOTE — Progress Notes (Signed)
Anesthesia consult by Dr Okey Dupre, no further treatment needed

## 2016-01-31 ENCOUNTER — Ambulatory Visit (HOSPITAL_BASED_OUTPATIENT_CLINIC_OR_DEPARTMENT_OTHER): Payer: 59 | Admitting: Certified Registered"

## 2016-01-31 ENCOUNTER — Encounter (HOSPITAL_BASED_OUTPATIENT_CLINIC_OR_DEPARTMENT_OTHER): Payer: Self-pay | Admitting: Anesthesiology

## 2016-01-31 ENCOUNTER — Encounter (HOSPITAL_BASED_OUTPATIENT_CLINIC_OR_DEPARTMENT_OTHER)
Admission: RE | Disposition: A | Discharge status: Home / Self Care | Payer: Self-pay | Source: Ambulatory Visit | Attending: Orthopedic Surgery

## 2016-01-31 ENCOUNTER — Ambulatory Visit (HOSPITAL_BASED_OUTPATIENT_CLINIC_OR_DEPARTMENT_OTHER)
Admission: RE | Admit: 2016-01-31 | Discharge: 2016-01-31 | Disposition: A | Discharge status: Home / Self Care | Payer: 59 | Source: Ambulatory Visit | Attending: Orthopedic Surgery | Admitting: Orthopedic Surgery

## 2016-01-31 DIAGNOSIS — F329 Major depressive disorder, single episode, unspecified: Secondary | ICD-10-CM | POA: Diagnosis not present

## 2016-01-31 DIAGNOSIS — K589 Irritable bowel syndrome without diarrhea: Secondary | ICD-10-CM | POA: Diagnosis not present

## 2016-01-31 DIAGNOSIS — Z6841 Body Mass Index (BMI) 40.0 and over, adult: Secondary | ICD-10-CM | POA: Diagnosis not present

## 2016-01-31 DIAGNOSIS — Z87891 Personal history of nicotine dependence: Secondary | ICD-10-CM | POA: Insufficient documentation

## 2016-01-31 DIAGNOSIS — F419 Anxiety disorder, unspecified: Secondary | ICD-10-CM | POA: Diagnosis not present

## 2016-01-31 DIAGNOSIS — G5601 Carpal tunnel syndrome, right upper limb: Secondary | ICD-10-CM | POA: Diagnosis present

## 2016-01-31 DIAGNOSIS — K219 Gastro-esophageal reflux disease without esophagitis: Secondary | ICD-10-CM | POA: Insufficient documentation

## 2016-01-31 HISTORY — PX: CARPAL TUNNEL RELEASE: SHX101

## 2016-01-31 SURGERY — CARPAL TUNNEL RELEASE
Anesthesia: Regional | Site: Wrist | Laterality: Right

## 2016-01-31 MED ORDER — HYDROCODONE-ACETAMINOPHEN 5-325 MG PO TABS
ORAL_TABLET | ORAL | Status: AC
  Filled 2016-01-31: qty 1

## 2016-01-31 MED ORDER — DEXAMETHASONE SODIUM PHOSPHATE 10 MG/ML IJ SOLN
INTRAMUSCULAR | Status: AC
  Filled 2016-01-31: qty 1

## 2016-01-31 MED ORDER — HYDROCODONE-ACETAMINOPHEN 5-325 MG PO TABS
1.0000 | ORAL_TABLET | Freq: Once | ORAL | Status: AC
  Administered 2016-01-31: 1 via ORAL

## 2016-01-31 MED ORDER — LIDOCAINE HCL (CARDIAC) 20 MG/ML IV SOLN
INTRAVENOUS | Status: DC | PRN
  Administered 2016-01-31: 50 mg via INTRAVENOUS

## 2016-01-31 MED ORDER — BUPIVACAINE HCL (PF) 0.25 % IJ SOLN
INTRAMUSCULAR | Status: DC | PRN
  Administered 2016-01-31: 10 mL

## 2016-01-31 MED ORDER — LACTATED RINGERS IV SOLN
INTRAVENOUS | Status: DC
  Administered 2016-01-31: 13:00:00 via INTRAVENOUS

## 2016-01-31 MED ORDER — LIDOCAINE 2% (20 MG/ML) 5 ML SYRINGE
INTRAMUSCULAR | Status: AC
  Filled 2016-01-31: qty 5

## 2016-01-31 MED ORDER — PROPOFOL 10 MG/ML IV BOLUS
INTRAVENOUS | Status: DC | PRN
  Administered 2016-01-31: 20 mg via INTRAVENOUS

## 2016-01-31 MED ORDER — MIDAZOLAM HCL 2 MG/2ML IJ SOLN
INTRAMUSCULAR | Status: AC
  Filled 2016-01-31: qty 2

## 2016-01-31 MED ORDER — CEFAZOLIN SODIUM-DEXTROSE 2-4 GM/100ML-% IV SOLN
INTRAVENOUS | Status: AC
  Filled 2016-01-31: qty 100

## 2016-01-31 MED ORDER — HYDROCODONE-ACETAMINOPHEN 5-325 MG PO TABS: ORAL_TABLET | ORAL | 0 refills | Status: DC

## 2016-01-31 MED ORDER — MIDAZOLAM HCL 2 MG/2ML IJ SOLN
1.0000 mg | INTRAMUSCULAR | Status: DC | PRN
  Administered 2016-01-31: 2 mg via INTRAVENOUS

## 2016-01-31 MED ORDER — FENTANYL CITRATE (PF) 100 MCG/2ML IJ SOLN: 25.0000 ug | INTRAMUSCULAR | Status: DC | PRN

## 2016-01-31 MED ORDER — SCOPOLAMINE 1 MG/3DAYS TD PT72: 1.0000 | MEDICATED_PATCH | Freq: Once | TRANSDERMAL | Status: DC | PRN

## 2016-01-31 MED ORDER — FENTANYL CITRATE (PF) 100 MCG/2ML IJ SOLN
50.0000 ug | INTRAMUSCULAR | Status: DC | PRN
  Administered 2016-01-31 (×2): 25 ug via INTRAVENOUS

## 2016-01-31 MED ORDER — CEFAZOLIN SODIUM-DEXTROSE 2-4 GM/100ML-% IV SOLN
2.0000 g | INTRAVENOUS | Status: AC
  Administered 2016-01-31: 3 g via INTRAVENOUS

## 2016-01-31 MED ORDER — FENTANYL CITRATE (PF) 100 MCG/2ML IJ SOLN
INTRAMUSCULAR | Status: AC
  Filled 2016-01-31: qty 2

## 2016-01-31 MED ORDER — LIDOCAINE HCL (PF) 0.5 % IJ SOLN
INTRAMUSCULAR | Status: DC | PRN
  Administered 2016-01-31: 30 mL via INTRAVENOUS

## 2016-01-31 MED ORDER — ONDANSETRON HCL 4 MG/2ML IJ SOLN
INTRAMUSCULAR | Status: AC
  Filled 2016-01-31: qty 2

## 2016-01-31 MED ORDER — GLYCOPYRROLATE 0.2 MG/ML IJ SOLN: 0.2000 mg | Freq: Once | INTRAMUSCULAR | Status: DC | PRN

## 2016-01-31 MED ORDER — ONDANSETRON HCL 4 MG/2ML IJ SOLN
INTRAMUSCULAR | Status: DC | PRN
  Administered 2016-01-31: 4 mg via INTRAVENOUS

## 2016-01-31 MED ORDER — CHLORHEXIDINE GLUCONATE 4 % EX LIQD: 60.0000 mL | Freq: Once | CUTANEOUS | Status: DC

## 2016-01-31 SURGICAL SUPPLY — 34 items
BANDAGE ACE 3X5.8 VEL STRL LF (GAUZE/BANDAGES/DRESSINGS) ×2 IMPLANT
BLADE SURG 15 STRL LF DISP TIS (BLADE) ×2 IMPLANT
BLADE SURG 15 STRL SS (BLADE) ×4
BNDG ESMARK 4X9 LF (GAUZE/BANDAGES/DRESSINGS) IMPLANT
BNDG GAUZE ELAST 4 BULKY (GAUZE/BANDAGES/DRESSINGS) ×2 IMPLANT
CHLORAPREP W/TINT 26ML (MISCELLANEOUS) ×2 IMPLANT
CORDS BIPOLAR (ELECTRODE) ×2 IMPLANT
COVER BACK TABLE 60X90IN (DRAPES) ×2 IMPLANT
COVER MAYO STAND STRL (DRAPES) ×2 IMPLANT
CUFF TOURNIQUET SINGLE 18IN (TOURNIQUET CUFF) ×2 IMPLANT
DRAPE EXTREMITY T 121X128X90 (DRAPE) ×2 IMPLANT
DRAPE SURG 17X23 STRL (DRAPES) ×2 IMPLANT
DRSG PAD ABDOMINAL 8X10 ST (GAUZE/BANDAGES/DRESSINGS) ×2 IMPLANT
GAUZE SPONGE 4X4 12PLY STRL (GAUZE/BANDAGES/DRESSINGS) ×2 IMPLANT
GAUZE XEROFORM 1X8 LF (GAUZE/BANDAGES/DRESSINGS) ×2 IMPLANT
GLOVE BIO SURGEON STRL SZ 6.5 (GLOVE) ×2 IMPLANT
GLOVE BIO SURGEON STRL SZ7 (GLOVE) ×2 IMPLANT
GLOVE BIO SURGEON STRL SZ7.5 (GLOVE) ×2 IMPLANT
GLOVE BIOGEL PI IND STRL 8 (GLOVE) ×1 IMPLANT
GLOVE BIOGEL PI INDICATOR 8 (GLOVE) ×1
GOWN STRL REUS W/ TWL LRG LVL3 (GOWN DISPOSABLE) ×1 IMPLANT
GOWN STRL REUS W/TWL LRG LVL3 (GOWN DISPOSABLE) ×1
GOWN STRL REUS W/TWL XL LVL3 (GOWN DISPOSABLE) ×2 IMPLANT
NEEDLE HYPO 25X1 1.5 SAFETY (NEEDLE) ×2 IMPLANT
NS IRRIG 1000ML POUR BTL (IV SOLUTION) ×2 IMPLANT
PACK BASIN DAY SURGERY FS (CUSTOM PROCEDURE TRAY) ×2 IMPLANT
PADDING CAST ABS 4INX4YD NS (CAST SUPPLIES) ×1
PADDING CAST ABS COTTON 4X4 ST (CAST SUPPLIES) ×1 IMPLANT
STOCKINETTE 4X48 STRL (DRAPES) ×2 IMPLANT
SUT ETHILON 4 0 PS 2 18 (SUTURE) ×2 IMPLANT
SYR BULB 3OZ (MISCELLANEOUS) ×2 IMPLANT
SYR CONTROL 10ML LL (SYRINGE) ×2 IMPLANT
TOWEL OR 17X24 6PK STRL BLUE (TOWEL DISPOSABLE) ×2 IMPLANT
UNDERPAD 30X30 (UNDERPADS AND DIAPERS) ×2 IMPLANT

## 2016-01-31 NOTE — Transfer of Care (Signed)
Last Vitals:  Vitals:   01/31/16 1239  BP: (!) 106/53  Pulse: 82  Resp: 18  Temp: 36.9 C    Last Pain:  Vitals:   01/31/16 1239  TempSrc: Oral         Immediate Anesthesia Transfer of Care Note  Patient: Kathy Carlson  Procedure(s) Performed: Procedure(s) (LRB): CARPAL TUNNEL RELEASE (Right)  Patient Location: PACU  Anesthesia Type: Bier Block, MAC  Level of Consciousness: awake, alert  and oriented  Airway & Oxygen Therapy: Patient Spontanous Breathing and Patient connected to face mask oxygen  Post-op Assessment: Report given to PACU RN and Post -op Vital signs reviewed and stable  Post vital signs: Reviewed and stable  Complications: No apparent anesthesia complications

## 2016-01-31 NOTE — Op Note (Signed)
NAME:  Kathy Carlson, Khaleah              ACCOUNT NO.:  [id number]  MEDICAL RECORD NO.:  [id number]  LOCATION:                                 FACILITY:  PHYSICIAN:  Betha LoaKevin Leiani Enright, MD             DATE OF BIRTH:  DATE OF PROCEDURE:  01/31/2016 DATE OF DISCHARGE:                              OPERATIVE REPORT   PREOPERATIVE DIAGNOSIS:  Right carpal tunnel syndrome.  POSTOPERATIVE DIAGNOSIS:  Right carpal tunnel syndrome.  PROCEDURE:  Right carpal tunnel release.  SURGEON:  Betha LoaKevin Odis Wickey, MD.  ASSISTANT:  None.  ANESTHESIA:  Bier block with sedation.  IV FLUIDS:  Per anesthesia flow sheet.  ESTIMATED BLOOD LOSS:  Minimal.  COMPLICATIONS:  None.  SPECIMENS:  None.  TOURNIQUET TIME:  21 minutes.  DISPOSITION:  Stable to PACU.  INDICATIONS:  Kathy Carlson is a 45 year old female who has had pins and needle sensation, diagnosis of carpal tunnel syndrome for approximately 10 years.  This is bothersome to her and she wished to have a surgical release.  Risks, benefits and alternatives of the surgery were discussed including the risk of blood loss; infection; damage to nerves, vessels, tendons, ligaments, bone; failure of surgery; need for additional surgery; complications with wound healing; continued pain; recurrence of carpal tunnel syndrome and damage to motor branch.  She voiced understanding of these risks and elected to proceed.  OPERATIVE COURSE:  After being identified preoperatively by myself, the patient and I agreed upon the procedure and site of procedure.  Surgical site was marked.  Risks, benefits and alternatives of the surgery were reviewed and she wished to proceed.  Surgical consent had been signed. She was given IV Ancef as preoperative antibiotic prophylaxis.  She was transferred to the operating room and placed on the operating room table in supine position with the right upper extremity on an armboard.  Bier block anesthesia was induced by Anesthesiology.  Right  upper extremity was prepped and draped in normal sterile orthopedic fashion.  A surgical pause was performed between surgeons, anesthesia and operating room staff and all were in agreement as to the patient, procedure and site of procedure.  Tourniquet at the proximal aspect of the forearm had been inflated through the Bier block.  Incision was made over the transverse carpal ligament, carried into the subcutaneous tissues by spreading technique.  Bipolar electrocautery was used to obtain hemostasis.  The palmar fascia was sharply incised.  The transverse carpal ligament was identified and sharply incised.  It was incised distally first.  Care was taken to ensure complete decompression distally.  It was then incised proximally.  Scissors were used to split the distal aspect of the volar antebrachial fascia.  A finger was placed into the wound to ensure complete decompression, which was the case.  The nerve was inspected.  It was flattened and hyperemic.  The motor branch was identified and was intact.  The wound was copiously irrigated with sterile saline and was closed with 4-0 nylon in a horizontal mattress fashion.  It was injected with 10 mL of 0.25% plain Marcaine to aid in postoperative analgesia.  It was then dressed with sterile Xeroform,  4x4s, and ABD and wrapped with Kerlix and Ace bandage.  Tourniquet was deflated at 21 minutes.  The fingertips were pink with brisk capillary refill after deflation of tourniquet.  Operative drapes were broken down and the patient was awoken from anesthesia safely.  She was transferred back to the stretcher and taken to PACU in stable condition.  I will see her back in the office in 1 week for postoperative followup.  I will give her Norco 5/325, 1-2 p.o. q.6 hours p.r.n. pain, dispensed #20.     Betha Loa, MD     KK/MEDQ  D:  01/31/2016  T:  01/31/2016  Job:  [id number]

## 2016-01-31 NOTE — Op Note (Signed)
[id number] 

## 2016-01-31 NOTE — H&P (Signed)
  Kathy Carlson is an 45 y.o. female.   Chief Complaint: right carpal tunnel syndrome HPI: 45 yo female with ten years of tingling sensation in right hand.  This is bothersome to her.  Positive nerve conduction studies.  She wishes to have a carpal tunnel release.  Allergies: No Known Allergies  Past Medical History:  Diagnosis Date  . Allergy   . Anxiety   . Depression   . IBS (irritable bowel syndrome)   . Migraine     Past Surgical History:  Procedure Laterality Date  . NO PAST SURGERIES      Family History: Family History  Problem Relation Age of Onset  . Thyroid disease Mother   . Cancer Maternal Grandmother   . Hypertension Maternal Grandmother   . Thyroid disease Sister   . Hypertension Maternal Grandfather   . Diabetes Father     Social History:   reports that she has quit smoking. She has never used smokeless tobacco. She reports that she drinks alcohol. She reports that she does not use drugs.  Medications: Medications Prior to Admission  Medication Sig Dispense Refill  . ketoprofen (ORUDIS) 50 MG capsule Take 50 mg by mouth 4 (four) times daily as needed.    . promethazine (PHENERGAN) 25 MG tablet Take 25 mg by mouth every 6 (six) hours as needed for nausea or vomiting.      No results found for this or any previous visit (from the past 48 hour(s)).  No results found.   A comprehensive review of systems was negative.  Blood pressure (!) 106/53, pulse 82, temperature 98.4 F (36.9 C), temperature source Oral, resp. rate 18, height 5\' 5"  (1.651 m), weight 132.9 kg (293 lb), last menstrual period 01/22/2016, SpO2 100 %.  General appearance: alert, cooperative and appears stated age Head: Normocephalic, without obvious abnormality, atraumatic Neck: supple, symmetrical, trachea midline Resp: clear to auscultation bilaterally Cardio: regular rate and rhythm GI: non-tender Extremities: Intact sensation and capillary refill all digits.  +epl/fpl/io.  No  wounds.  Pulses: 2+ and symmetric Skin: Skin color, texture, turgor normal. No rashes or lesions Neurologic: Grossly normal Incision/Wound:none  Assessment/Plan Right carpal tunnel syndrome.  Non operative and operative treatment options were discussed with the patient and patient wishes to proceed with operative treatment. Risks, benefits, and alternatives of surgery were discussed and the patient agrees with the plan of care.   Kathy Carlson R 01/31/2016, 1:40 PM

## 2016-01-31 NOTE — Anesthesia Procedure Notes (Signed)
Procedure Name: MAC Date/Time: 01/31/2016 2:00 PM Performed by: Bonita QuinGUIDETTI, RICHARD S Pre-anesthesia Checklist: Patient identified, Emergency Drugs available, Suction available and Patient being monitored Oxygen Delivery Method: Simple face mask Placement Confirmation: positive ETCO2 and breath sounds checked- equal and bilateral

## 2016-01-31 NOTE — Discharge Instructions (Addendum)

## 2016-01-31 NOTE — Brief Op Note (Signed)
01/31/2016  2:29 PM  PATIENT:  Kathy Carlson  45 y.o. female  PRE-OPERATIVE DIAGNOSIS:  right carpal tunnel syndrome  POST-OPERATIVE DIAGNOSIS:  Right Carpal Tunnel Syndrome  PROCEDURE:  Procedure(s): CARPAL TUNNEL RELEASE (Right)  SURGEON:  Surgeon(s) and Role:    * Betha LoaKevin Elbridge Magowan, MD - Primary  PHYSICIAN ASSISTANT:   ASSISTANTS: none   ANESTHESIA:   Bier block with sedation  EBL:  No intake/output data recorded.  BLOOD ADMINISTERED:none  DRAINS: none   LOCAL MEDICATIONS USED:  MARCAINE     SPECIMEN:  No Specimen  DISPOSITION OF SPECIMEN:  N/A  COUNTS:  YES  TOURNIQUET:   Total Tourniquet Time Documented: Forearm (Right) - 21 minutes Total: Forearm (Right) - 21 minutes   DICTATION: .Other Dictation: Dictation Number [id number]  PLAN OF CARE: Discharge to home after PACU  PATIENT DISPOSITION:  PACU - hemodynamically stable.

## 2016-01-31 NOTE — Anesthesia Postprocedure Evaluation (Signed)
Anesthesia Post Note  Patient: Kathy Carlson  Procedure(s) Performed: Procedure(s) (LRB): CARPAL TUNNEL RELEASE (Right)  Patient location during evaluation: PACU Anesthesia Type: MAC Level of consciousness: awake and alert Pain management: pain level controlled Vital Signs Assessment: post-procedure vital signs reviewed and stable Respiratory status: spontaneous breathing, nonlabored ventilation and respiratory function stable Cardiovascular status: stable and blood pressure returned to baseline Anesthetic complications: no    Last Vitals:  Vitals:   01/31/16 1436 01/31/16 1445  BP: 123/74 118/75  Pulse:  63  Resp: 12 17  Temp: 37 C     Last Pain:  Vitals:   01/31/16 1445  TempSrc:   PainSc: 0-No pain                 Colby Reels,W. EDMOND

## 2016-01-31 NOTE — Anesthesia Preprocedure Evaluation (Addendum)
Anesthesia Evaluation  Patient identified by MRN, date of birth, ID band Patient awake    Reviewed: Allergy & Precautions, NPO status , Patient's Chart, lab work & pertinent test results  Airway Mallampati: III  TM Distance: >3 FB Neck ROM: Full    Dental no notable dental hx.    Pulmonary former smoker,    Pulmonary exam normal        Cardiovascular negative cardio ROS Normal cardiovascular exam     Neuro/Psych  Headaches, PSYCHIATRIC DISORDERS Anxiety Depression    GI/Hepatic Neg liver ROS, GERD  Poorly Controlled,IBS   Endo/Other  Morbid obesity  Renal/GU negative Renal ROS     Musculoskeletal negative musculoskeletal ROS (+)   Abdominal   Peds  Hematology negative hematology ROS (+)   Anesthesia Other Findings Day of surgery medications reviewed with the patient.  Reproductive/Obstetrics                           Anesthesia Physical Anesthesia Plan  ASA: II  Anesthesia Plan: Bier Block   Post-op Pain Management:    Induction:   Airway Management Planned: Mask  Additional Equipment: None  Intra-op Plan:   Post-operative Plan:   Informed Consent: I have reviewed the patients History and Physical, chart, labs and discussed the procedure including the risks, benefits and alternatives for the proposed anesthesia with the patient or authorized representative who has indicated his/her understanding and acceptance.   Dental advisory given  Plan Discussed with: CRNA and Surgeon  Anesthesia Plan Comments:       Anesthesia Quick Evaluation

## 2016-02-02 ENCOUNTER — Encounter (HOSPITAL_BASED_OUTPATIENT_CLINIC_OR_DEPARTMENT_OTHER): Payer: Self-pay | Admitting: Orthopedic Surgery

## 2016-04-21 ENCOUNTER — Ambulatory Visit: Payer: 59

## 2016-05-06 ENCOUNTER — Other Ambulatory Visit: Payer: Self-pay | Admitting: Emergency Medicine

## 2016-05-06 DIAGNOSIS — J301 Allergic rhinitis due to pollen: Secondary | ICD-10-CM

## 2016-11-12 DIAGNOSIS — G43111 Migraine with aura, intractable, with status migrainosus: Secondary | ICD-10-CM | POA: Diagnosis not present

## 2016-11-12 DIAGNOSIS — G43839 Menstrual migraine, intractable, without status migrainosus: Secondary | ICD-10-CM | POA: Diagnosis not present

## 2016-11-22 DIAGNOSIS — Z01419 Encounter for gynecological examination (general) (routine) without abnormal findings: Secondary | ICD-10-CM | POA: Diagnosis not present

## 2016-11-22 DIAGNOSIS — Z124 Encounter for screening for malignant neoplasm of cervix: Secondary | ICD-10-CM | POA: Diagnosis not present

## 2016-12-25 DIAGNOSIS — N760 Acute vaginitis: Secondary | ICD-10-CM | POA: Diagnosis not present

## 2017-05-13 DIAGNOSIS — G43839 Menstrual migraine, intractable, without status migrainosus: Secondary | ICD-10-CM | POA: Diagnosis not present

## 2017-05-13 DIAGNOSIS — G43111 Migraine with aura, intractable, with status migrainosus: Secondary | ICD-10-CM | POA: Diagnosis not present

## 2017-08-20 DIAGNOSIS — M549 Dorsalgia, unspecified: Secondary | ICD-10-CM | POA: Diagnosis not present

## 2018-01-17 DIAGNOSIS — Z1231 Encounter for screening mammogram for malignant neoplasm of breast: Secondary | ICD-10-CM | POA: Diagnosis not present

## 2018-01-17 DIAGNOSIS — Z01419 Encounter for gynecological examination (general) (routine) without abnormal findings: Secondary | ICD-10-CM | POA: Diagnosis not present

## 2018-01-17 DIAGNOSIS — N760 Acute vaginitis: Secondary | ICD-10-CM | POA: Diagnosis not present

## 2018-02-19 DIAGNOSIS — G43111 Migraine with aura, intractable, with status migrainosus: Secondary | ICD-10-CM | POA: Diagnosis not present

## 2018-04-29 ENCOUNTER — Ambulatory Visit: Payer: 59 | Admitting: Podiatry

## 2018-04-29 ENCOUNTER — Ambulatory Visit (INDEPENDENT_AMBULATORY_CARE_PROVIDER_SITE_OTHER): Payer: 59

## 2018-04-29 DIAGNOSIS — F329 Major depressive disorder, single episode, unspecified: Secondary | ICD-10-CM | POA: Insufficient documentation

## 2018-04-29 DIAGNOSIS — M2012 Hallux valgus (acquired), left foot: Secondary | ICD-10-CM

## 2018-04-29 DIAGNOSIS — M2011 Hallux valgus (acquired), right foot: Secondary | ICD-10-CM | POA: Diagnosis not present

## 2018-04-29 DIAGNOSIS — B351 Tinea unguium: Secondary | ICD-10-CM

## 2018-04-29 DIAGNOSIS — N760 Acute vaginitis: Secondary | ICD-10-CM | POA: Insufficient documentation

## 2018-04-29 DIAGNOSIS — R102 Pelvic and perineal pain: Secondary | ICD-10-CM | POA: Insufficient documentation

## 2018-04-29 DIAGNOSIS — F32A Depression, unspecified: Secondary | ICD-10-CM | POA: Insufficient documentation

## 2018-04-29 NOTE — Patient Instructions (Signed)

## 2018-04-30 DIAGNOSIS — M2012 Hallux valgus (acquired), left foot: Secondary | ICD-10-CM | POA: Insufficient documentation

## 2018-04-30 DIAGNOSIS — B351 Tinea unguium: Secondary | ICD-10-CM | POA: Insufficient documentation

## 2018-04-30 NOTE — Progress Notes (Signed)
Subjective:   Patient ID: Kathy Carlson, female   DOB: 47 y.o.   MRN: [id number]   HPI 47 year old female presents the office today for concerns of bilateral bunion deformity with the left side worse.  She is.  She states that hurts with walking and certain shoes.  She denies any numbness or tingling denies any swelling.  She also is concerned about her toenails.  She says that her right big toenail started about a year ago becoming discolored she does note the left big toenail in the last day or so.  Also over the last couple months her right second toenail started become discolored and she just started to notice this as she took her toenail polish off.  She denies any pain in the toenails and she denies any redness or drainage or any swelling.  She has no other concerns.   Review of Systems  All other systems reviewed and are negative.  Past Medical History:  Diagnosis Date  . Allergy   . Anxiety   . Depression   . IBS (irritable bowel syndrome)   . Migraine     Past Surgical History:  Procedure Laterality Date  . CARPAL TUNNEL RELEASE Right 01/31/2016   Procedure: CARPAL TUNNEL RELEASE;  Surgeon: Betha Loa, MD;  Location: [facility];  Service: Orthopedics;  Laterality: Right;  . NO PAST SURGERIES       Current Outpatient Medications:  .  amoxicillin-clavulanate (AUGMENTIN) 875-125 MG tablet, amoxicillin 875 mg-potassium clavulanate 125 mg tablet, Disp: , Rfl:  .  chlorpheniramine-HYDROcodone (TUSSIONEX) 10-8 MG/5ML SUER, hydrocodone 10 mg-chlorpheniramine 8 mg/5 mL oral susp extend.rel 12hr, Disp: , Rfl:  .  cyclobenzaprine (FLEXERIL) 10 MG tablet, cyclobenzaprine 10 mg tablet, Disp: , Rfl:  .  diazepam (VALIUM) 10 MG tablet, diazepam 10 mg tablet, Disp: , Rfl:  .  doxycycline (VIBRAMYCIN) 100 MG capsule, doxycycline hyclate 100 mg capsule, Disp: , Rfl:  .  FLUoxetine (PROZAC) 20 MG tablet, Take 20 mg by mouth daily., Disp: , Rfl: 1 .  HYDROcodone-acetaminophen  (NORCO) 5-325 MG tablet, 1-2 tabs po q6 hours prn pain, Disp: 20 tablet, Rfl: 0 .  ibuprofen (ADVIL,MOTRIN) 600 MG tablet, ibuprofen 600 mg tablet, Disp: , Rfl:  .  ipratropium (ATROVENT) 0.03 % nasal spray, ipratropium bromide 0.03 % nasal spray, Disp: , Rfl:  .  ketoprofen (ORUDIS) 50 MG capsule, Take 50 mg by mouth 4 (four) times daily as needed., Disp: , Rfl:  .  naproxen sodium (ANAPROX) 550 MG tablet, naproxen sodium 550 mg tablet, Disp: , Rfl:  .  norethindrone-ethinyl estradiol (JINTELI) 1-5 MG-MCG TABS tablet, Jinteli 1 mg-5 mcg tablet, Disp: , Rfl:  .  phentermine (ADIPEX-P) 37.5 MG tablet, phentermine 37.5 mg tablet, Disp: , Rfl:  .  promethazine (PHENERGAN) 25 MG tablet, Take 25 mg by mouth every 6 (six) hours as needed for nausea or vomiting., Disp: , Rfl:   Allergies  Allergen Reactions  . Other          Objective:  Physical Exam  General: AAO x3, NAD  Dermatological: Bilateral hallux toenails as well as right second digit toenails hypertrophic, dystrophic with yellow to brown discoloration in the right hallux toenail is the most severe.  There is no pain in the nails there is no surrounding redness or drainage or any clinical signs of infection noted today.  Vascular: Dorsalis Pedis artery and Posterior Tibial artery pedal pulses are 2/4 bilateral with immedate capillary fill time. There is no pain with  calf compression, swelling, warmth, erythema.   Neruologic: Grossly intact via light touch bilateral. Protective threshold with Semmes Wienstein monofilament intact to all pedal sites bilateral.   Musculoskeletal: Moderate bunion deformities present there is tenderness palpation shortly on the bunion deformity with the left side worse than the right.  There is no first ray hypermobility present there is no pain or crepitation of the first MPJ range of motion.  No other areas of tenderness identified at this time.. Muscular strength 5/5 in all groups tested bilateral.  Gait:  Unassisted, Nonantalgic.      Assessment:   Bilateral symptomatic bunion deformity with onychomycosis     Plan:  1.  Bunion deformity Left is worse than right -X-rays were obtained and reviewed.  Moderate bunion deformities present.  No evidence of acute fracture or stress fracture identified today. -We discussed various treatment options both conservatively as well as surgically.  Conservative treatment we discussed continued shoe modifications, offloading orthotics.  We can consider a steroid injection as well as well as topical anti-inflammatory.  She would consider surgery.  We discussed Austin bunionectomy with screw fixation.  We discussed the surgery as well as postoperative course.  Discussed with her the bunion deformity can help with pain but were not doing this for cosmetic reasons.  She does have that she wants to do this for cosmetic reasons I do not advise doing a bunion surgery for cosmetic reasons if it continues to be painful we can do this for her.  Since it is painful she is can think about this.  2.  Onychomycosis -After a discuss in regards to treatment options she elects to do topical. Prescribed a compound cream through [employer].  We discussed treatment options for nail fungus and discussed with her is not anything that can be 100% for nail fungus and is difficult to treat.  We also discussed oral as well as topical and laser therapy.  Vivi Barrack DPM

## 2018-07-03 DIAGNOSIS — B351 Tinea unguium: Secondary | ICD-10-CM | POA: Diagnosis not present

## 2018-07-03 DIAGNOSIS — L600 Ingrowing nail: Secondary | ICD-10-CM | POA: Diagnosis not present

## 2018-07-03 DIAGNOSIS — M79672 Pain in left foot: Secondary | ICD-10-CM | POA: Diagnosis not present

## 2018-07-03 DIAGNOSIS — M79671 Pain in right foot: Secondary | ICD-10-CM | POA: Diagnosis not present

## 2018-08-13 DIAGNOSIS — R002 Palpitations: Secondary | ICD-10-CM | POA: Diagnosis not present

## 2018-08-13 DIAGNOSIS — R030 Elevated blood-pressure reading, without diagnosis of hypertension: Secondary | ICD-10-CM | POA: Diagnosis not present

## 2018-08-15 DIAGNOSIS — M79675 Pain in left toe(s): Secondary | ICD-10-CM | POA: Diagnosis not present

## 2018-08-15 DIAGNOSIS — L600 Ingrowing nail: Secondary | ICD-10-CM | POA: Diagnosis not present

## 2018-08-15 DIAGNOSIS — B351 Tinea unguium: Secondary | ICD-10-CM | POA: Diagnosis not present

## 2018-08-15 DIAGNOSIS — M79674 Pain in right toe(s): Secondary | ICD-10-CM | POA: Diagnosis not present

## 2018-12-18 ENCOUNTER — Ambulatory Visit: Payer: 59 | Admitting: Podiatry

## 2018-12-18 ENCOUNTER — Encounter: Payer: Self-pay | Admitting: Podiatry

## 2018-12-18 ENCOUNTER — Other Ambulatory Visit: Payer: Self-pay

## 2018-12-18 ENCOUNTER — Telehealth: Payer: Self-pay | Admitting: Podiatry

## 2018-12-18 VITALS — Temp 97.2°F

## 2018-12-18 DIAGNOSIS — M7989 Other specified soft tissue disorders: Secondary | ICD-10-CM | POA: Diagnosis not present

## 2018-12-18 DIAGNOSIS — B351 Tinea unguium: Secondary | ICD-10-CM | POA: Diagnosis not present

## 2018-12-18 DIAGNOSIS — T148XXA Other injury of unspecified body region, initial encounter: Secondary | ICD-10-CM

## 2018-12-18 MED ORDER — CEPHALEXIN 500 MG PO CAPS: 500.0000 mg | ORAL_CAPSULE | Freq: Three times a day (TID) | ORAL | 0 refills | Status: DC

## 2018-12-18 NOTE — Telephone Encounter (Signed)
Sent over to the corrected pharmacy. Kathy Carlson

## 2018-12-18 NOTE — Telephone Encounter (Signed)
Pt was seen in office today and had a prescription sent in but it was sent to the incorrect pharmacy. Pt would like for it to be sent to the [facility] in [place].

## 2018-12-21 NOTE — Progress Notes (Signed)
Subjective: 48 year old female presents the office today for concerns of a cut to her left fifth toe walk about 4 weeks ago.  She states that she is also had some swelling to the area.  She states this happened when she was walking and she cut it on concrete other injury or falls.   No redness or drainage.  She is also interested in starting laser treatment for nail fungus. Denies any systemic complaints such as fevers, chills, nausea, vomiting. No acute changes since last appointment, and no other complaints at this time.   Objective: AAO x3, NAD DP/PT pulses palpable bilaterally, CRT less than 3 seconds Nails are mildly discolored with yellow-brown discoloration.  There is no pain in the nails there is no scrapings or drainage or signs of infection. Mild edema to the left fifth toe.  Mild tenderness palpation.  Is no erythema or warmth.  No fluctuation or crepitation.  The areas of skin abrasions appear to be healed. No open lesions or pre-ulcerative lesions.  No pain with calf compression, swelling, warmth, erythema  Assessment: Skin abrasion with localized swelling left fifth toe; onychomycosis  Plan: -All treatment options discussed with the patient including all alternatives, risks, complications.  -Regards this point for the fifth toe the wound appear to be healing well however given of still swelling on going to order Keflex.  Continue wider toe box to avoid pressure of the toe.  Ice to the area.  Limit activity. -We will reappoint her to start laser therapy for her nails at her convenience. -Patient encouraged to call the office with any questions, concerns, change in symptoms.   Trula Slade DPM

## 2018-12-23 ENCOUNTER — Ambulatory Visit (INDEPENDENT_AMBULATORY_CARE_PROVIDER_SITE_OTHER): Payer: 59

## 2018-12-23 ENCOUNTER — Other Ambulatory Visit: Payer: Self-pay

## 2018-12-23 DIAGNOSIS — B351 Tinea unguium: Secondary | ICD-10-CM

## 2018-12-23 NOTE — Patient Instructions (Signed)

## 2018-12-30 NOTE — Progress Notes (Signed)
Pt presents with mycotic infection of nails 1 through 5 bilateral.  All other systems are negative  Laser therapy administered to affected nails and tolerated well. All safety precautions were in place.  First treatment.  Follow up in 4 weeks

## 2019-01-08 ENCOUNTER — Encounter: Payer: Self-pay | Admitting: Podiatry

## 2019-01-08 ENCOUNTER — Other Ambulatory Visit: Payer: Self-pay

## 2019-01-08 ENCOUNTER — Ambulatory Visit: Payer: 59 | Admitting: Podiatry

## 2019-01-08 ENCOUNTER — Ambulatory Visit (INDEPENDENT_AMBULATORY_CARE_PROVIDER_SITE_OTHER): Payer: 59

## 2019-01-08 VITALS — Temp 97.3°F

## 2019-01-08 DIAGNOSIS — S99922A Unspecified injury of left foot, initial encounter: Secondary | ICD-10-CM

## 2019-01-08 DIAGNOSIS — S92502A Displaced unspecified fracture of left lesser toe(s), initial encounter for closed fracture: Secondary | ICD-10-CM

## 2019-01-08 NOTE — Progress Notes (Signed)
Subjective: 48 year old female presents the office today for follow-up evaluation of left fifth toe swelling.  She states that she finished course of Keflex and overall is doing much better.  She has no pain to the toe but she still notices some residual swelling but no redness or warmth.  No wounds to the heel.  This started after she hit her foot on concrete when she scraped her foot. Denies any systemic complaints such as fevers, chills, nausea, vomiting. No acute changes since last appointment, and no other complaints at this time.   Objective: AAO x3, NAD DP/PT pulses palpable bilaterally, CRT less than 3 seconds The wounds to the toe is healed.  There is still some mild edema distally but there is no erythema or increase in warmth.  There is no areas of fluctuation crepitation.  There is no tenderness to palpation of the toe.  Adductovarus is present bilaterally and is equal bilaterally.  No open lesions or pre-ulcerative lesions.  No pain with calf compression, swelling, warmth, erythema  Assessment: Left fifth toe fracture, healed ulcer.  Plan: -All treatment options discussed with the patient including all alternatives, risks, complications.  -X-rays were obtained and reviewed.  There is a radiolucency transversing the distal phalanx of the fifth digit consistent with a fracture. -I showed her how to buddy splint the toe today.  Also to help with the swelling.  Continue shoes that do not apply pressure to the fifth toe.  Does not appear to be in any signs of clinical infection otherwise will hold off any further antibiotics.  The wound is healed. -She is scheduled for second major treatment and I will see her back at the same time for repeat x-ray. -Patient encouraged to call the office with any questions, concerns, change in symptoms.   Trula Slade DPM

## 2019-01-22 ENCOUNTER — Ambulatory Visit: Payer: 59

## 2019-01-22 ENCOUNTER — Ambulatory Visit (INDEPENDENT_AMBULATORY_CARE_PROVIDER_SITE_OTHER): Payer: 59

## 2019-01-22 ENCOUNTER — Other Ambulatory Visit: Payer: Self-pay

## 2019-01-22 ENCOUNTER — Encounter: Payer: Self-pay | Admitting: Podiatry

## 2019-01-22 ENCOUNTER — Ambulatory Visit: Payer: Self-pay | Admitting: Podiatry

## 2019-01-22 ENCOUNTER — Ambulatory Visit (INDEPENDENT_AMBULATORY_CARE_PROVIDER_SITE_OTHER): Payer: 59 | Admitting: Podiatry

## 2019-01-22 DIAGNOSIS — M7989 Other specified soft tissue disorders: Secondary | ICD-10-CM

## 2019-01-22 DIAGNOSIS — S92912A Unspecified fracture of left toe(s), initial encounter for closed fracture: Secondary | ICD-10-CM | POA: Diagnosis not present

## 2019-01-22 DIAGNOSIS — B351 Tinea unguium: Secondary | ICD-10-CM

## 2019-01-22 DIAGNOSIS — S99922A Unspecified injury of left foot, initial encounter: Secondary | ICD-10-CM

## 2019-01-22 DIAGNOSIS — S92502A Displaced unspecified fracture of left lesser toe(s), initial encounter for closed fracture: Secondary | ICD-10-CM

## 2019-01-22 DIAGNOSIS — S92502D Displaced unspecified fracture of left lesser toe(s), subsequent encounter for fracture with routine healing: Secondary | ICD-10-CM

## 2019-01-22 NOTE — Progress Notes (Signed)
Subjective: 48 year old female presents the office today for evaluation of left toe fracture, toe swelling.  She still states that she has some swelling but she has no longer any pain to the area she denies any redness or warmth uppercase the area.  She also presents today for laser treatment for nail fungus. Denies any systemic complaints such as fevers, chills, nausea, vomiting. No acute changes since last appointment, and no other complaints at this time.   Objective: AAO x3, NAD DP/PT pulses palpable bilaterally, CRT less than 3 seconds There is still some mild edema of the left fifth toe but is no erythema or warmth.  No open sores there is no fluctuation crepitation or any clinical signs of infection.  The nail is dystrophic and discolored.  No pain. No open lesions or pre-ulcerative lesions.  No pain with calf compression, swelling, warmth, erythema  Assessment: Healing fracture left fifth toe with onychomycosis  Plan: -All treatment options discussed with the patient including all alternatives, risks, complications.  -She underwent laser treatment today understand precautions with Jessica.  She tolerated well. -X-rays were obtained and reviewed.  Still fracture present of the phalanx of the fifth toe but evidence of healing.  Fracture still evident however.  She is having no pain.  Continue wide toed shoes to help avoid any pressure.  She has any issues to me know but discussed the swelling will take some time to resolve. -Patient encouraged to call the office with any questions, concerns, change in symptoms.   Trula Slade DPM

## 2019-01-23 ENCOUNTER — Other Ambulatory Visit: Payer: Self-pay | Admitting: Podiatry

## 2019-01-23 DIAGNOSIS — S92912A Unspecified fracture of left toe(s), initial encounter for closed fracture: Secondary | ICD-10-CM

## 2019-02-23 ENCOUNTER — Other Ambulatory Visit: Payer: Self-pay

## 2019-02-23 ENCOUNTER — Ambulatory Visit: Payer: Self-pay | Admitting: Podiatry

## 2019-02-23 DIAGNOSIS — B351 Tinea unguium: Secondary | ICD-10-CM

## 2019-02-27 NOTE — Progress Notes (Signed)
Pt presents with mycotic infection of nails 1 through 5 bilateral.  All other systems are negative  Laser therapy administered to affected nails and tolerated well. All safety precautions were in place. 2nd treatment.  Follow up in 4 weeks     

## 2019-03-20 NOTE — Progress Notes (Signed)
Pt presents with mycotic infection of nails 1 through 5 bilateral.  All other systems are negative  Laser therapy administered to affected nails and tolerated well. All safety precautions were in place. 3rd treatment.  Follow up in 4 weeks     

## 2019-04-23 ENCOUNTER — Other Ambulatory Visit: Payer: Self-pay

## 2019-04-23 ENCOUNTER — Ambulatory Visit (INDEPENDENT_AMBULATORY_CARE_PROVIDER_SITE_OTHER): Payer: 59 | Admitting: Podiatry

## 2019-04-23 ENCOUNTER — Encounter: Payer: Self-pay | Admitting: Podiatry

## 2019-04-23 DIAGNOSIS — M2041 Other hammer toe(s) (acquired), right foot: Secondary | ICD-10-CM | POA: Diagnosis not present

## 2019-04-23 DIAGNOSIS — B351 Tinea unguium: Secondary | ICD-10-CM

## 2019-04-23 DIAGNOSIS — M2042 Other hammer toe(s) (acquired), left foot: Secondary | ICD-10-CM | POA: Diagnosis not present

## 2019-04-23 DIAGNOSIS — M2012 Hallux valgus (acquired), left foot: Secondary | ICD-10-CM | POA: Diagnosis not present

## 2019-04-24 NOTE — Progress Notes (Signed)
Subjective: 48 year old female presents the office today for follow-up evaluation of toenail fungus.  She states that she has had 3 treatments in the laser and this is done well for her but the right toenail still discolored more towards the tip of the nail.  No pain the nail.  No redness or drainage.  Also she states that she has a bunion left foot that is tender at times mostly with shoes and she wants to consider surgery for this.  She has tried shoe modifications.  She is also noticed that her third toe is both effective or curled but not causing any pain.  Denies any systemic complaints such as fevers, chills, nausea, vomiting. No acute changes since last appointment, and no other complaints at this time.   Objective: AAO x3, NAD DP/PT pulses palpable bilaterally, CRT less than 3 seconds Overall the nails have improved.  There is mild yellow-brown discoloration the distal aspect of right hallux toenail but there is no pain.  No edema, erythema. Moderate bunion is present left foot with subjectively tenderness on the medial aspect of first metatarsal head.  No pain or restriction with MPJ range of motion no first ray hypermobility present.  Visual deformity present of the left third and fifth digit.  On the left third toe itself the DIPJ of the fifth toe at the PIPJ.  No pain at the toes. No pain with calf compression, swelling, warmth, erythema  Assessment: Onychomycosis with improvement, bunion left foot  Plan: -All treatment options discussed with the patient including all alternatives, risks, complications.  -Today she completed her fourth laser treatment following standard precautions of the right hallux toenail.  She has antifungal medicine at home but I want her to continue to use as well. -We discussed surgery for the left foot bunion.  We discussed also bunionectomy with screw fixation.  Discussed the procedure as well as postoperative course.  She will consider doing this after the  first year.  We discussed repair of the third and fifth digits but is not causing any pain started recommend holding off on surgery at this time but she does not continue to monitor the symptoms. -Patient encouraged to call the office with any questions, concerns, change in symptoms.   Trula Slade DPM

## 2019-06-25 ENCOUNTER — Telehealth: Payer: Self-pay | Admitting: *Deleted

## 2019-06-25 NOTE — Telephone Encounter (Signed)
"  I need to schedule my surgery with Dr. Jacqualyn Posey."  Have you signed your consent forms?  "No, I have not."  You need to schedule an appointment to see Dr. Jacqualyn Posey.  We'll get you scheduled at that visit.  Would you like me to transfer you to an appointment scheduler so you can schedule that appointment?  "Yes, I would."  I transferred her to [place].

## 2019-07-13 ENCOUNTER — Ambulatory Visit: Payer: 59 | Attending: Internal Medicine

## 2019-07-13 DIAGNOSIS — Z20822 Contact with and (suspected) exposure to covid-19: Secondary | ICD-10-CM

## 2019-07-14 ENCOUNTER — Ambulatory Visit: Payer: 59 | Admitting: Podiatry

## 2019-07-14 LAB — NOVEL CORONAVIRUS, NAA: SARS-CoV-2, NAA: NOT DETECTED

## 2019-07-23 ENCOUNTER — Ambulatory Visit: Payer: 59 | Admitting: Podiatry

## 2019-07-23 ENCOUNTER — Other Ambulatory Visit: Payer: Self-pay

## 2019-07-23 ENCOUNTER — Encounter: Payer: Self-pay | Admitting: Podiatry

## 2019-07-23 ENCOUNTER — Ambulatory Visit (INDEPENDENT_AMBULATORY_CARE_PROVIDER_SITE_OTHER): Payer: 59

## 2019-07-23 DIAGNOSIS — M2012 Hallux valgus (acquired), left foot: Secondary | ICD-10-CM | POA: Diagnosis not present

## 2019-07-23 DIAGNOSIS — M722 Plantar fascial fibromatosis: Secondary | ICD-10-CM | POA: Diagnosis not present

## 2019-07-23 MED ORDER — MELOXICAM 15 MG PO TABS: 15.0000 mg | ORAL_TABLET | Freq: Every day | ORAL | 0 refills | Status: AC

## 2019-07-23 MED ORDER — MELOXICAM 15 MG PO TABS: 15.0000 mg | ORAL_TABLET | Freq: Every day | ORAL | 0 refills | Status: DC

## 2019-07-23 NOTE — Patient Instructions (Addendum)
For instructions on how to put on your Night Splint, please visit [email or web address]   Plantar Fasciitis (Heel Spur Syndrome) with Rehab The plantar fascia is a fibrous, ligament-like, soft-tissue structure that spans the bottom of the foot. Plantar fasciitis is a condition that causes pain in the foot due to inflammation of the tissue. SYMPTOMS   Pain and tenderness on the underneath side of the foot.  Pain that worsens with standing or walking. CAUSES  Plantar fasciitis is caused by irritation and injury to the plantar fascia on the underneath side of the foot. Common mechanisms of injury include:  Direct trauma to bottom of the foot.  Damage to a small nerve that runs under the foot where the main fascia attaches to the heel bone.  Stress placed on the plantar fascia due to bone spurs. RISK INCREASES WITH:   Activities that place stress on the plantar fascia (running, jumping, pivoting, or cutting).  Poor strength and flexibility.  Improperly fitted shoes.  Tight calf muscles.  Flat feet.  Failure to warm-up properly before activity.  Obesity. PREVENTION  Warm up and stretch properly before activity.  Allow for adequate recovery between workouts.  Maintain physical fitness:  Strength, flexibility, and endurance.  Cardiovascular fitness.  Maintain a health body weight.  Avoid stress on the plantar fascia.  Wear properly fitted shoes, including arch supports for individuals who have flat feet.  PROGNOSIS  If treated properly, then the symptoms of plantar fasciitis usually resolve without surgery. However, occasionally surgery is necessary.  RELATED COMPLICATIONS   Recurrent symptoms that may result in a chronic condition.  Problems of the lower back that are caused by compensating for the injury, such as limping.  Pain or weakness of the foot during push-off following surgery.  Chronic inflammation, scarring, and partial or complete fascia tear,  occurring more often from repeated injections.  TREATMENT  Treatment initially involves the use of ice and medication to help reduce pain and inflammation. The use of strengthening and stretching exercises may help reduce pain with activity, especially stretches of the Achilles tendon. These exercises may be performed at home or with a therapist. Your caregiver may recommend that you use heel cups of arch supports to help reduce stress on the plantar fascia. Occasionally, corticosteroid injections are given to reduce inflammation. If symptoms persist for greater than 6 months despite non-surgical (conservative), then surgery may be recommended.   MEDICATION   If pain medication is necessary, then nonsteroidal anti-inflammatory medications, such as aspirin and ibuprofen, or other minor pain relievers, such as acetaminophen, are often recommended.  Do not take pain medication within 7 days before surgery.  Prescription pain relievers may be given if deemed necessary by your caregiver. Use only as directed and only as much as you need.  Corticosteroid injections may be given by your caregiver. These injections should be reserved for the most serious cases, because they may only be given a certain number of times.  HEAT AND COLD  Cold treatment (icing) relieves pain and reduces inflammation. Cold treatment should be applied for 10 to 15 minutes every 2 to 3 hours for inflammation and pain and immediately after any activity that aggravates your symptoms. Use ice packs or massage the area with a piece of ice (ice massage).  Heat treatment may be used prior to performing the stretching and strengthening activities prescribed by your caregiver, physical therapist, or athletic trainer. Use a heat pack or soak the injury in warm water.  SEEK IMMEDIATE MEDICAL CARE   IF:  Treatment seems to offer no benefit, or the condition worsens.  Any medications produce adverse side effects.  EXERCISES- RANGE OF  MOTION (ROM) AND STRETCHING EXERCISES - Plantar Fasciitis (Heel Spur Syndrome) These exercises may help you when beginning to rehabilitate your injury. Your symptoms may resolve with or without further involvement from your physician, physical therapist or athletic trainer. While completing these exercises, remember:   Restoring tissue flexibility helps normal motion to return to the joints. This allows healthier, less painful movement and activity.  An effective stretch should be held for at least 30 seconds.  A stretch should never be painful. You should only feel a gentle lengthening or release in the stretched tissue.  RANGE OF MOTION - Toe Extension, Flexion  Sit with your right / left leg crossed over your opposite knee.  Grasp your toes and gently pull them back toward the top of your foot. You should feel a stretch on the bottom of your toes and/or foot.  Hold this stretch for 10 seconds.  Now, gently pull your toes toward the bottom of your foot. You should feel a stretch on the top of your toes and or foot.  Hold this stretch for 10 seconds. Repeat  times. Complete this stretch 3 times per day.   RANGE OF MOTION - Ankle Dorsiflexion, Active Assisted  Remove shoes and sit on a chair that is preferably not on a carpeted surface.  Place right / left foot under knee. Extend your opposite leg for support.  Keeping your heel down, slide your right / left foot back toward the chair until you feel a stretch at your ankle or calf. If you do not feel a stretch, slide your bottom forward to the edge of the chair, while still keeping your heel down.  Hold this stretch for 10 seconds. Repeat 3 times. Complete this stretch 2 times per day.   STRETCH  Gastroc, Standing  Place hands on wall.  Extend right / left leg, keeping the front knee somewhat bent.  Slightly point your toes inward on your back foot.  Keeping your right / left heel on the floor and your knee straight, shift  your weight toward the wall, not allowing your back to arch.  You should feel a gentle stretch in the right / left calf. Hold this position for 10 seconds. Repeat 3 times. Complete this stretch 2 times per day.  STRETCH  Soleus, Standing  Place hands on wall.  Extend right / left leg, keeping the other knee somewhat bent.  Slightly point your toes inward on your back foot.  Keep your right / left heel on the floor, bend your back knee, and slightly shift your weight over the back leg so that you feel a gentle stretch deep in your back calf.  Hold this position for 10 seconds. Repeat 3 times. Complete this stretch 2 times per day.  STRETCH  Gastrocsoleus, Standing  Note: This exercise can place a lot of stress on your foot and ankle. Please complete this exercise only if specifically instructed by your caregiver.   Place the ball of your right / left foot on a step, keeping your other foot firmly on the same step.  Hold on to the wall or a rail for balance.  Slowly lift your other foot, allowing your body weight to press your heel down over the edge of the step.  You should feel a stretch in your right / left calf.  Hold this position   for 10 seconds.  Repeat this exercise with a slight bend in your right / left knee. Repeat 3 times. Complete this stretch 2 times per day.   STRENGTHENING EXERCISES - Plantar Fasciitis (Heel Spur Syndrome)  These exercises may help you when beginning to rehabilitate your injury. They may resolve your symptoms with or without further involvement from your physician, physical therapist or athletic trainer. While completing these exercises, remember:   Muscles can gain both the endurance and the strength needed for everyday activities through controlled exercises.  Complete these exercises as instructed by your physician, physical therapist or athletic trainer. Progress the resistance and repetitions only as guided.  STRENGTH - Towel Curls  Sit in  a chair positioned on a non-carpeted surface.  Place your foot on a towel, keeping your heel on the floor.  Pull the towel toward your heel by only curling your toes. Keep your heel on the floor. Repeat 3 times. Complete this exercise 2 times per day.  STRENGTH - Ankle Inversion  Secure one end of a rubber exercise band/tubing to a fixed object (table, pole). Loop the other end around your foot just before your toes.  Place your fists between your knees. This will focus your strengthening at your ankle.  Slowly, pull your big toe up and in, making sure the band/tubing is positioned to resist the entire motion.  Hold this position for 10 seconds.  Have your muscles resist the band/tubing as it slowly pulls your foot back to the starting position. Repeat 3 times. Complete this exercises 2 times per day.  Document Released: 06/11/2005 Document Revised: 09/03/2011 Document Reviewed: 09/23/2008 [facility] Patient Information 2014 [place], [place].   Pre-Operative Instructions  Congratulations, you have decided to take an important step towards improving your quality of life.  You can be assured that the doctors and staff at [facility] will be with you every step of the way.  Here are some important things you should know:  1. Plan to be at the surgery center/hospital at least 1 (one) hour prior to your scheduled time, unless otherwise directed by the surgical center/hospital staff.  You must have a responsible adult accompany you, remain during the surgery and drive you home.  Make sure you have directions to the surgical center/hospital to ensure you arrive on time. 2. If you are having surgery at [facility] or [facility], you will need a copy of your medical history and physical form from your family physician within one month prior to the date of surgery. We will give you a form for your primary physician to complete.  3. We make every effort to accommodate the date you  request for surgery.  However, there are times where surgery dates or times have to be moved.  We will contact you as soon as possible if a change in schedule is required.   4. No aspirin/ibuprofen for one week before surgery.  If you are on aspirin, any non-steroidal anti-inflammatory medications (Mobic, Aleve, Ibuprofen) should not be taken seven (7) days prior to your surgery.  You make take Tylenol for pain prior to surgery.  5. Medications - If you are taking daily heart and blood pressure medications, seizure, reflux, allergy, asthma, anxiety, pain or diabetes medications, make sure you notify the surgery center/hospital before the day of surgery so they can tell you which medications you should take or avoid the day of surgery. 6. No food or drink after midnight the night before surgery unless  directed otherwise by surgical center/hospital staff. 7. No alcoholic beverages [id number] prior to surgery.  No smoking 24-hours prior or 24-hours after surgery. 8. Wear loose pants or shorts. They should be loose enough to fit over bandages, boots, and casts. 9. Don't wear slip-on shoes. Sneakers are preferred. 10. Bring your boot with you to the surgery center/hospital.  Also bring crutches or a walker if your physician has prescribed it for you.  If you do not have this equipment, it will be provided for you after surgery. 11. If you have not been contacted by the surgery center/hospital by the day before your surgery, call to confirm the date and time of your surgery. 12. Leave-time from work may vary depending on the type of surgery you have.  Appropriate arrangements should be made prior to surgery with your employer. 13. Prescriptions will be provided immediately following surgery by your doctor.  Fill these as soon as possible after surgery and take the medication as directed. Pain medications will not be refilled on weekends and must be approved by the doctor. 14. Remove nail polish on the operative  foot and avoid getting pedicures prior to surgery. 15. Wash the night before surgery.  The night before surgery wash the foot and leg well with water and the antibacterial soap provided. Be sure to pay special attention to beneath the toenails and in between the toes.  Wash for at least three (3) minutes. Rinse thoroughly with water and dry well with a towel.  Perform this wash unless told not to do so by your physician.  Enclosed: 1 Ice pack (please put in freezer the night before surgery)   1 Hibiclens skin cleaner   Pre-op instructions  If you have any questions regarding the instructions, please do not hesitate to call our office.  [place]: [address] [place], [place] [address] -- [place]: [address] [place], [place] [address] -- [phone]  [place]: [place] [address] [place], [place] [address] -- [phone]   Website: [email or web address]

## 2019-07-24 DIAGNOSIS — M722 Plantar fascial fibromatosis: Secondary | ICD-10-CM | POA: Insufficient documentation

## 2019-07-24 NOTE — Progress Notes (Signed)
Subjective: 49 year old female presents the office today for surgical consultation given painful bunion deformity left foot.  She is attempted conservative care including shoe modifications, offloading padding with a significant improvement.  She also states that she has noticed plantar fasciitis symptoms again.  She had this previously and she started to notice pain in the morning when she first except the bottom of her heel.  Gets better with activity.  She denies any recent injury to the foot.  No swelling.  No radiating pain or weakness. Denies any systemic complaints such as fevers, chills, nausea, vomiting. No acute changes since last appointment, and no other complaints at this time.   Objective: AAO x3, NAD DP/PT pulses palpable bilaterally, CRT less than 3 seconds Moderate bunion deformity present on the left foot with tenderness palpation of the left first metatarsal head medially.  No pain or crepitation with MPJ range of motion.  There is no first ray hypermobility present.  There is mild tenderness palpation on the plantar medial tubercle of the calcaneus at insertion of plantar fascia.  The plantar fascia appears to be intact.  There is no pain with lateral compression of calcaneus.  No pain with Achilles tendon.  There is no appreciated discomfort identified today. No open lesions or pre-ulcerative lesions.  No pain with calf compression, swelling, warmth, erythema  Assessment: Left foot plantar fasciitis with symptomatic bony deform  Plan: -All treatment options discussed with the patient including all alternatives, risks, complications.  -Repeat x-rays were obtained and reviewed.  Moderate bunion deformities present.  There is no evidence of acute fracture. -Regards to the bunion we discussed with conservative as well as surgical treatment options.  At this time she attempted conservative care that any significant resolution and she was to proceed with surgical intervention.  We  discussed left foot Austin bunionectomy with screw fixation. -The incision placement as well as the postoperative course was discussed with the patient. I discussed risks of the surgery which include, but not limited to, infection, bleeding, pain, swelling, need for further surgery, delayed or nonhealing, painful or ugly scar, numbness or sensation changes, over/under correction, recurrence, transfer lesions, further deformity, hardware failure, DVT/PE, loss of toe/foot. Patient understands these risks and wishes to proceed with surgery. The surgical consent was reviewed with the patient all 3 pages were signed. No promises or guarantees were given to the outcome of the procedure. All questions were answered to the best of my ability. Before the surgery the patient was encouraged to call the office if there is any further questions. The surgery will be performed at the [facility] on an outpatient basis.  Cam boot was given for postoperative use -Regards to plantar fasciitis discussed stretching, icing daily.  Prescribe meloxicam discussed side effects. Night splint dispensed.  Continue supportive shoes and inserts. -Patient encouraged to call the office with any questions, concerns, change in symptoms.   Kathy Carlson DPM

## 2019-07-31 ENCOUNTER — Telehealth: Payer: Self-pay | Admitting: Podiatry

## 2019-07-31 NOTE — Telephone Encounter (Signed)
DOS: 08/12/2019  SURGICAL PROCEDURE: Kathy Carlson [id number])  [employer] Effective: 06/26/2019  Deductible is $1,000 with $0 met and $1,000 remaining. Out of Pocket is $5,000 with $164.50 met and $4,835.50 remaining.  Prior Authorization# [id number] was approved on 07/27/2019.

## 2019-08-04 ENCOUNTER — Telehealth: Payer: Self-pay | Admitting: *Deleted

## 2019-08-04 NOTE — Telephone Encounter (Signed)
"  I'm scheduled to have bunion surgery next Wednesday but I also have Plantar Fasciitis.  Dr. Ardelle Anton prescribed Meloxicam, an anti-inflammatory.  I just want to make sure it's okay to take this during this time or if I should wait after my surgery to take it.  Please give me a call back."

## 2019-08-05 ENCOUNTER — Telehealth: Payer: Self-pay | Admitting: *Deleted

## 2019-08-05 NOTE — Telephone Encounter (Signed)
Hold off on NSAIDs a week prior to surgery.

## 2019-08-05 NOTE — Telephone Encounter (Signed)
Patient called and wanted to know if she can still take the meloxicam even if she is having surgery and I stated that she could and when the anesthesiologist calls just mention that you are taken the medicine and they can tell you if you can take it before the surgery or wait till after surgery. Misty Stanley

## 2019-08-05 NOTE — Telephone Encounter (Signed)
Left message for patient to stop NSAIDs today per Dr. Ardelle Anton.

## 2019-08-11 ENCOUNTER — Other Ambulatory Visit: Payer: Self-pay | Admitting: Podiatry

## 2019-08-11 MED ORDER — CEPHALEXIN 500 MG PO CAPS: 500.0000 mg | ORAL_CAPSULE | Freq: Three times a day (TID) | ORAL | 0 refills | Status: DC

## 2019-08-11 MED ORDER — PROMETHAZINE HCL 25 MG PO TABS: 25.0000 mg | ORAL_TABLET | Freq: Three times a day (TID) | ORAL | 0 refills | Status: DC | PRN

## 2019-08-11 MED ORDER — OXYCODONE-ACETAMINOPHEN 5-325 MG PO TABS: 1.0000 | ORAL_TABLET | Freq: Four times a day (QID) | ORAL | 0 refills | Status: DC | PRN

## 2019-08-11 NOTE — Progress Notes (Signed)
I called the patient to see if she has any questions/concerns about surgery tomorrow. Answered her questions. Sent medications to the pharmacy for postop use. Will plan as scheduled  Vivi Barrack

## 2019-08-12 ENCOUNTER — Encounter: Payer: Self-pay | Admitting: Podiatry

## 2019-08-12 DIAGNOSIS — M2012 Hallux valgus (acquired), left foot: Secondary | ICD-10-CM | POA: Diagnosis not present

## 2019-08-13 ENCOUNTER — Telehealth: Payer: Self-pay | Admitting: *Deleted

## 2019-08-13 NOTE — Telephone Encounter (Signed)
Called the patient this morning to see how she was doing after having surgery on Wednesday with Dr Ardelle Anton and patient stated that she was doing pretty good and the numbness has not wore off yet and the toes look nice and no fever or chills and no nausea and patient has not taken any pain medicine yet and I stated to try and take at least one pill right when you notice the tingling feeling coming back into your foot so patient can get ahead of the pain if there is any and to call the office with any concerns or questions and that we would see patient next week for post op appointment. Kathy Carlson

## 2019-08-17 ENCOUNTER — Ambulatory Visit (INDEPENDENT_AMBULATORY_CARE_PROVIDER_SITE_OTHER): Payer: 59

## 2019-08-17 ENCOUNTER — Other Ambulatory Visit: Payer: Self-pay

## 2019-08-17 ENCOUNTER — Ambulatory Visit (INDEPENDENT_AMBULATORY_CARE_PROVIDER_SITE_OTHER): Payer: 59 | Admitting: Podiatry

## 2019-08-17 ENCOUNTER — Encounter: Payer: Self-pay | Admitting: Podiatry

## 2019-08-17 VITALS — BP 119/73 | HR 73 | Temp 97.3°F | Resp 16

## 2019-08-17 DIAGNOSIS — M2012 Hallux valgus (acquired), left foot: Secondary | ICD-10-CM

## 2019-08-17 DIAGNOSIS — Z9889 Other specified postprocedural states: Secondary | ICD-10-CM

## 2019-08-17 NOTE — Progress Notes (Signed)
Subjective: Kathy Carlson is a 49 y.o. is seen today in office s/p left foot Austin bunionectomy preformed on 08/12/2019.  She states that she is doing well and her pain is controlled.  She has not had any pain medicine since yesterday and took ibuprofen this morning.  She is wearing the cam boot.  Denies any systemic complaints such as fevers, chills, nausea, vomiting. No calf pain, chest pain, shortness of breath.   Objective: General: No acute distress, AAOx3  DP/PT pulses palpable 2/4, CRT < 3 sec to all digits.  Protective sensation intact. Motor function intact.  LEFT foot: Incision is well coapted without any evidence of dehiscence with sutures intact.  On the proximal aspect incision there is a small amount of blood but there is no opening of the incision.  There is no surrounding erythema, ascending cellulitis, fluctuance, crepitus, malodor, drainage/purulence. There is mild edema around the surgical site. There is minimal pain along the surgical site.  The toe is in rectus position. No other areas of tenderness to bilateral lower extremities.  No other open lesions or pre-ulcerative lesions.  No pain with calf compression, swelling, warmth, erythema.   Assessment and Plan:  Status post left foot Austin bunionectomy, doing well with no complications   -Treatment options discussed including all alternatives, risks, and complications -X-rays obtained reviewed.  Hardware intact status post first metatarsal osteotomy.  No evidence of acute fracture otherwise -Betadine was applied to the incision followed by dry sterile dressing.  Keep the dressing clean, dry, intact -Remain in CAM boot  -Ice/elevation -Pain medication as needed. Discussed going to do using only Ibuprofen. She wants to go back to work. She has a sitting job. She can go back to work as long as she can keep the foot elevated and not taking pain medication.  -Monitor for any clinical signs or symptoms of infection and DVT/PE  and directed to call the office immediately should any occur or go to the ER. -Follow-up in 1 week to check the incision or sooner if any problems arise. In the meantime, encouraged to call the office with any questions, concerns, change in symptoms.   Ovid Curd, DPM

## 2019-08-21 ENCOUNTER — Telehealth: Payer: Self-pay | Admitting: *Deleted

## 2019-08-21 NOTE — Telephone Encounter (Signed)
Pt states she had her 1st bunion 08/17/2019 and her toes look more bruised than before.

## 2019-08-21 NOTE — Telephone Encounter (Signed)
I spoke with pt and informed that the bruising was the cycle of the blood that had rushed to the foot for healing, it would probably look more black and blue, then yellow and this may even drain to the ankle due to how she is having to rest with the toes up and the heel down. Pt states understanding.

## 2019-08-24 ENCOUNTER — Ambulatory Visit (INDEPENDENT_AMBULATORY_CARE_PROVIDER_SITE_OTHER): Payer: 59 | Admitting: Podiatry

## 2019-08-24 ENCOUNTER — Other Ambulatory Visit: Payer: Self-pay

## 2019-08-24 DIAGNOSIS — Z9889 Other specified postprocedural states: Secondary | ICD-10-CM

## 2019-08-24 DIAGNOSIS — M2012 Hallux valgus (acquired), left foot: Secondary | ICD-10-CM

## 2019-08-27 ENCOUNTER — Encounter: Payer: 59 | Admitting: Podiatry

## 2019-08-31 NOTE — Progress Notes (Signed)
Subjective: Kathy Carlson is a 49 y.o. is seen today in office s/p left foot Austin bunionectomy preformed on 08/12/2019.  Overall she states that she is doing well.  She gets some occasional discomfort but denies any severe pain.  Get some occasional swelling.  Stitches are still intact as are present stable possible removal.  No recent injury or trauma since I last saw her she has no other concerns.  Denies any systemic complaints such as fevers, chills, nausea, vomiting. No calf pain, chest pain, shortness of breath.   Objective: General: No acute distress, AAOx3  DP/PT pulses palpable 2/4, CRT < 3 sec to all digits.  Protective sensation intact. Motor function intact.  LEFT foot: Incision is well coapted without any evidence of dehiscence with sutures intact.  Incision appears to be healing well but any evidence of dehiscence.  Mild swelling and minimal ecchymosis.  There is no drainage or pus or any clinical signs of infection noted today.  Improved range of motion of first MPJ.  The toes in rectus position. No other areas of tenderness to bilateral lower extremities.  No other open lesions or pre-ulcerative lesions.  No pain with calf compression, swelling, warmth, erythema.   Assessment and Plan:  Status post left foot Austin bunionectomy, doing well with no complications   -Treatment options discussed including all alternatives, risks, and complications -Remove the sutures today without any complications.  Incision is healing well.  She can start to get the incision wet but dry thoroughly and apply a small amount of antibiotic ointment.  Continue range of motion exercises.  Made an offloading boot.  Continue to ice elevate. -Monitor for any clinical signs or symptoms of infection and directed to call the office immediately should any occur or go to the ER.  Return in about 2 weeks (around 09/07/2019) for post-op check/x-ray.  Vivi Barrack DPM

## 2019-09-03 ENCOUNTER — Other Ambulatory Visit: Payer: Self-pay | Admitting: Podiatry

## 2019-09-03 ENCOUNTER — Telehealth: Payer: Self-pay | Admitting: *Deleted

## 2019-09-03 MED ORDER — OXYCODONE-ACETAMINOPHEN 5-325 MG PO TABS: 1.0000 | ORAL_TABLET | Freq: Four times a day (QID) | ORAL | 0 refills | Status: DC | PRN

## 2019-09-03 NOTE — Telephone Encounter (Signed)
I spoke with pt and she states she is having sharp pains on and off in the surgery toe, not influenced by activity can be asleep and occur. I told pt I would inform Dr. Ardelle Anton and to check her pharmacy later today.

## 2019-09-03 NOTE — Telephone Encounter (Signed)
sent 

## 2019-09-03 NOTE — Telephone Encounter (Signed)
Pt states she would like a refill of the oxycodone. 

## 2019-09-10 ENCOUNTER — Ambulatory Visit (INDEPENDENT_AMBULATORY_CARE_PROVIDER_SITE_OTHER): Payer: 59

## 2019-09-10 ENCOUNTER — Ambulatory Visit (INDEPENDENT_AMBULATORY_CARE_PROVIDER_SITE_OTHER): Payer: 59 | Admitting: Podiatry

## 2019-09-10 ENCOUNTER — Encounter: Payer: Self-pay | Admitting: Podiatry

## 2019-09-10 ENCOUNTER — Other Ambulatory Visit: Payer: Self-pay

## 2019-09-10 VITALS — Temp 97.2°F

## 2019-09-10 DIAGNOSIS — Z9889 Other specified postprocedural states: Secondary | ICD-10-CM

## 2019-09-10 DIAGNOSIS — M2012 Hallux valgus (acquired), left foot: Secondary | ICD-10-CM | POA: Diagnosis not present

## 2019-09-11 NOTE — Progress Notes (Signed)
Subjective: Kathy Carlson is a 49 y.o. is seen today in office s/p left foot Austin bunionectomy preformed on 08/12/2019.  States that she is doing well.  She takes pain medication intermittently but not on a daily basis.  Otherwise she is making ibuprofen.  Her pain level is 3/10.  Still some swelling but improving.  No other concerns.  No recent injury. Denies any systemic complaints such as fevers, chills, nausea, vomiting. No calf pain, chest pain, shortness of breath.   Objective: General: No acute distress, AAOx3  DP/PT pulses palpable 2/4, CRT < 3 sec to all digits.  Protective sensation intact. Motor function intact.  LEFT foot: Incision is well coapted without any evidence of dehiscence with cellulitis suture lines intact.  Incisions healing well. There is no evidence of dehiscence.  There is no drainage or any surrounding erythema. The incision appears to be healing well without any evidence of dehiscence.  After range of motion of the first MPJ without any discomfort. No other open lesions or pre-ulcerative lesions.  No pain with calf compression, swelling, warmth, erythema.   Assessment and Plan:  Status post left foot Austin bunionectomy, doing well with no complications   -Treatment options discussed including all alternatives, risks, and complications -X-rays obtained reviewed.  Hardware intact status post [employer].  Increased consolidation across the osteotomy site. -Antibiotic ointment and a bandage applied.  She can start to shower but dry thoroughly and apply a small amount of antibiotic ointment.  Discussed the range of motion exercises.  Remain in cam boot.  Ice elevation.  Compression anklet.  Return in about 2 weeks (around 09/24/2019).  Vivi Barrack DPM

## 2019-09-24 ENCOUNTER — Ambulatory Visit (INDEPENDENT_AMBULATORY_CARE_PROVIDER_SITE_OTHER): Payer: 59 | Admitting: Podiatry

## 2019-09-24 ENCOUNTER — Ambulatory Visit (INDEPENDENT_AMBULATORY_CARE_PROVIDER_SITE_OTHER): Payer: 59

## 2019-09-24 ENCOUNTER — Other Ambulatory Visit: Payer: Self-pay

## 2019-09-24 ENCOUNTER — Encounter: Payer: Self-pay | Admitting: Podiatry

## 2019-09-24 VITALS — Temp 97.8°F

## 2019-09-24 DIAGNOSIS — M2012 Hallux valgus (acquired), left foot: Secondary | ICD-10-CM | POA: Diagnosis not present

## 2019-09-24 DIAGNOSIS — Z9889 Other specified postprocedural states: Secondary | ICD-10-CM

## 2019-09-30 NOTE — Progress Notes (Signed)
Subjective: Kathy Carlson is a 49 y.o. is seen today in office s/p left foot Austin bunionectomy preformed on 08/12/2019.  She states that overall she is doing better.  No significant swelling no issues.  She still in the offloading boot.  No recent injury or falls otherwise.  She is not currently taking anything for pain. Denies any systemic complaints such as fevers, chills, nausea, vomiting. No calf pain, chest pain, shortness of breath.   Objective: General: No acute distress, AAOx3  DP/PT pulses palpable 2/4, CRT < 3 sec to all digits.  Protective sensation intact. Motor function intact.  LEFT foot: Incision is well coapted without any evidence of dehiscence and the scar has formed.  There is trace edema.  There is no erythema or warmth.  No pain with MPJ range of motion.  Hallux malleus is present which is present prior to surgery as well.   No other open lesions or pre-ulcerative lesions.  No pain with calf compression, swelling, warmth, erythema.   Assessment and Plan:  Status post left foot Austin bunionectomy, doing well with no complications   -Treatment options discussed including all alternatives, risks, and complications -X-rays obtained reviewed.  Hardware intact status post first metatarsal osteotomy.  First MPJ joint appears to be in alignment however there is slight hallux abductus which I think is coming from a hallux malleus. -She started transition to regular shoe.  Compression anklet.  Continue to ice elevate.  Range of motion exercises.  Prescription for physical therapy for [facility] was written today.  Vivi Barrack DPM

## 2019-10-22 ENCOUNTER — Encounter: Payer: Self-pay | Admitting: Podiatry

## 2019-10-22 ENCOUNTER — Ambulatory Visit (INDEPENDENT_AMBULATORY_CARE_PROVIDER_SITE_OTHER): Payer: 59 | Admitting: Podiatry

## 2019-10-22 DIAGNOSIS — M2012 Hallux valgus (acquired), left foot: Secondary | ICD-10-CM

## 2019-10-22 MED ORDER — MELOXICAM 15 MG PO TABS: 15.0000 mg | ORAL_TABLET | Freq: Every day | ORAL | 0 refills | Status: DC

## 2019-10-22 NOTE — Progress Notes (Signed)
Subjective: Kathy Carlson is a 49 y.o. is seen today in office s/p left foot Austin bunionectomy preformed on 08/12/2019.  She has been doing well and she feels that physical therapy has been helpful for her.  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 Denies any systemic complaints such as fevers, chills, nausea, vomiting. No calf pain, chest pain, shortness of breath.   Objective: General: No acute distress, AAOx3  DP/PT pulses palpable 2/4, CRT < 3 sec to all digits.  Protective sensation intact. Motor function intact.  LEFT foot: Incision is well coapted without any evidence of dehiscence and the scar has formed.  There is minimal edema.  There is no erythema or warmth.  No pain with MPJ range of motion.  Hallux malleus is present which is present prior to surgery as well.  There is no pain or crepitation or restriction with MPJ range of motion. No other open lesions or pre-ulcerative lesions.  No pain with calf compression, swelling, warmth, erythema.   Assessment and Plan:  Status post left foot Austin bunionectomy, doing well with no complications   -Treatment options discussed including all alternatives, risks, and complications -She is doing well not having any significant pain.  She been a regular shoe full-time.  She is still making progress by going physical therapy and she want to continue with this.  Continue home exercise as well.  She is having some plantar fasciitis symptoms likely that physical therapy getting back to her regular shoe will help.  Return in about 6 weeks (around 12/03/2019), or if symptoms worsen or fail to improve, for post-op visit/plantar fasciitis .  Vivi Barrack DPM

## 2019-10-22 NOTE — Patient Instructions (Signed)

## 2019-11-20 ENCOUNTER — Other Ambulatory Visit: Payer: Self-pay | Admitting: Podiatry

## 2019-12-03 ENCOUNTER — Other Ambulatory Visit: Payer: Self-pay

## 2019-12-03 ENCOUNTER — Ambulatory Visit (INDEPENDENT_AMBULATORY_CARE_PROVIDER_SITE_OTHER): Payer: 59 | Admitting: Podiatry

## 2019-12-03 DIAGNOSIS — M722 Plantar fascial fibromatosis: Secondary | ICD-10-CM | POA: Diagnosis not present

## 2019-12-03 NOTE — Progress Notes (Signed)
Subjective: 49 year old female presents the office today for concerns of left heel pain, plantar fasciitis.  She states the bunion is doing well and she has noticed physical therapy but she still getting pain in the bottom of her heel but is intermittent.  No recent injury or falls.  No swelling.  No rating pain.  She does start to describe pain to the back of her heel as well.  No significant swelling. Denies any systemic complaints such as fevers, chills, nausea, vomiting. No acute changes since last appointment, and no other complaints at this time.   Objective: AAO x3, NAD DP/PT pulses palpable bilaterally, CRT less than 3 seconds Tenderness palpation to the plantar medial tubercle of the calcaneus insertion of plantar fascia.  Plantar fascial appears to be intact.  There is no significant discomfort on exam for the posterior calcaneus or Achilles tendon.  Achilles tendon appears to be intact..  No pain about the posterior calcaneus.  No pain with bunion and no significant edema.  No erythema.  No pain with calf compression, swelling, warmth, erythema  Assessment: Left heel pain, plantar fasciitis  Plan: -All treatment options discussed with the patient including all alternatives, risks, complications.  -Steroid injection performed today.  See procedure note below.  Plan fascial brace dispensed.  Continue stretching, icing daily.  Continue night splint that she has at home.  Anti-inflammatories as needed. -Plan he is doing well I am discharging her from postoperative course for the bunion. -Patient encouraged to call the office with any questions, concerns, change in symptoms.   Procedure: Injection Tendon/Ligament Discussed alternatives, risks, complications and verbal consent was obtained.  Location: LEFT plantar fascia at the glabrous junction; medial approach. Skin Prep: Alcohol  Injectate: 0.5cc 0.5% marcaine plain, 0.5 cc 2% lidocaine plain and, 1 cc kenalog 10. Disposition: Patient  tolerated procedure well. Injection site dressed with a band-aid.  Post-injection care was discussed and return precautions discussed.   Return for plantar fasciitis in 4-6 weeks.  Vivi Barrack DPM

## 2019-12-19 ENCOUNTER — Other Ambulatory Visit: Payer: Self-pay | Admitting: Podiatry

## 2020-01-14 ENCOUNTER — Encounter: Payer: Self-pay | Admitting: Podiatry

## 2020-01-14 ENCOUNTER — Ambulatory Visit: Payer: 59 | Admitting: Podiatry

## 2020-01-14 ENCOUNTER — Other Ambulatory Visit: Payer: Self-pay

## 2020-01-14 DIAGNOSIS — M722 Plantar fascial fibromatosis: Secondary | ICD-10-CM

## 2020-01-14 DIAGNOSIS — M2012 Hallux valgus (acquired), left foot: Secondary | ICD-10-CM

## 2020-01-18 NOTE — Progress Notes (Signed)
Subjective: 48 year old female presents the office today for follow-up evaluation of left heel pain, plantar fasciitis.  She states that she has noted some mild improvement the injection but not significant.  She wants to consider doing another injection today.  She still using a plantar fascial brace and overall she feels that she is better than previous but still having discomfort. Denies any systemic complaints such as fevers, chills, nausea, vomiting. No acute changes since last appointment, and no other complaints at this time.   Objective: AAO x3, NAD DP/PT pulses palpable bilaterally, CRT less than 3 seconds There is continued mild improvement tenderness to the plantar medial tubercle of the calcaneus with insertion of plantar fashion the left side.  Plantar fascial appears to be intact.  No pain with lateral compression of calcaneus.  Negative Tinel sign. No pain with Achilles tendon.  No significant tenderness on the bunion site.  No open lesions or pre-ulcerative lesions.  No pain with calf compression, swelling, warmth, erythema  Assessment: Left heel pain, plantar fasciitis  Plan: -All treatment options discussed with the patient including all alternatives, risks, complications.  -Second steroid injection was performed.  See procedure note below.  Continue stretching, icing daily as well as orthotics, brace, shoe modifications. -Patient encouraged to call the office with any questions, concerns, change in symptoms.   Procedure: Injection Tendon/Ligament Discussed alternatives, risks, complications and verbal consent was obtained.  Location: Left plantar fascia at the glabrous junction; medial approach. Skin Prep: Alcohol. Injectate: 0.5cc 0.5% marcaine plain, 0.5 cc 2% lidocaine plain and, 1 cc kenalog 10. Disposition: Patient tolerated procedure well. Injection site dressed with a band-aid.  Post-injection care was discussed and return precautions discussed.   No follow-ups on  file.  Vivi Barrack DPM

## 2020-01-26 ENCOUNTER — Other Ambulatory Visit: Payer: Self-pay | Admitting: Podiatry

## 2020-02-18 ENCOUNTER — Ambulatory Visit: Payer: 59 | Admitting: Podiatry

## 2020-05-28 ENCOUNTER — Ambulatory Visit: Payer: 59 | Attending: Internal Medicine

## 2020-05-28 DIAGNOSIS — Z23 Encounter for immunization: Secondary | ICD-10-CM

## 2020-05-28 NOTE — Progress Notes (Signed)
   Covid-19 Vaccination [facility]  Name:  Kathy Carlson    MRN: [id number] DOB: 02/05/1971  05/28/2020  Kathy Carlson was observed post Covid-19 immunization for 15 minutes without incident. She was provided with Vaccine Information Sheet and instruction to access the V-Safe system.   Kathy Carlson was instructed to call 911 with any severe reactions post vaccine: Marland Kitchen Difficulty breathing  . Swelling of face and throat  . A fast heartbeat  . A bad rash all over body  . Dizziness and weakness   Immunizations Administered    Name Date Dose VIS Date Route   Pfizer COVID-19 Vaccine 05/28/2020 11:43 AM 0.3 mL 04/13/2020 Intramuscular   Manufacturer: [employer], [employer]   Lot: [id number]   NDC: [address]-5

## 2021-05-23 ENCOUNTER — Other Ambulatory Visit: Payer: Self-pay | Admitting: Orthopedic Surgery

## 2021-05-23 DIAGNOSIS — M5416 Radiculopathy, lumbar region: Secondary | ICD-10-CM

## 2021-06-23 ENCOUNTER — Ambulatory Visit
Admission: RE | Admit: 2021-06-23 | Discharge: 2021-06-23 | Disposition: A | Discharge status: Home / Self Care | Payer: 59 | Source: Ambulatory Visit | Attending: Orthopedic Surgery | Admitting: Orthopedic Surgery

## 2021-06-23 ENCOUNTER — Other Ambulatory Visit: Payer: Self-pay

## 2021-06-23 DIAGNOSIS — M5416 Radiculopathy, lumbar region: Secondary | ICD-10-CM

## 2021-11-28 ENCOUNTER — Ambulatory Visit: Payer: 59 | Admitting: Podiatry

## 2021-11-28 ENCOUNTER — Ambulatory Visit (INDEPENDENT_AMBULATORY_CARE_PROVIDER_SITE_OTHER): Payer: 59

## 2021-11-28 DIAGNOSIS — S92424A Nondisplaced fracture of distal phalanx of right great toe, initial encounter for closed fracture: Secondary | ICD-10-CM | POA: Diagnosis not present

## 2021-11-28 DIAGNOSIS — M79671 Pain in right foot: Secondary | ICD-10-CM

## 2021-11-28 DIAGNOSIS — S93401A Sprain of unspecified ligament of right ankle, initial encounter: Secondary | ICD-10-CM

## 2021-11-28 DIAGNOSIS — M722 Plantar fascial fibromatosis: Secondary | ICD-10-CM | POA: Diagnosis not present

## 2021-11-28 NOTE — Patient Instructions (Signed)
Ankle Sprain, Phase I Rehab An ankle sprain is an injury to the ligaments of your ankle. Ankle sprains cause stiffness, loss of motion, and loss of strength. Ask your health care provider which exercises are safe for you. Do exercises exactly as told by your health care provider and adjust them as directed. It is normal to feel mild stretching, pulling, tightness, or discomfort as you do these exercises. Stop right away if you feel sudden pain or your pain gets worse. Do not begin these exercises until told by your health care provider. Stretching and range-of-motion exercises These exercises warm up your muscles and joints and improve the movement and flexibility of your lower leg and ankle. These exercises also help to relieve pain and stiffness. Gastroc and soleus stretch This exercise is also called a calf stretch. It stretches the muscles in the back of the lower leg. These muscles are the gastrocnemius, or gastroc, and the soleus. Sit on the floor with your left / right leg extended. Loop a belt or towel around the ball of your left / right foot. The ball of your foot is on the walking surface, right under your toes. Keep your left / right ankle and foot relaxed and keep your knee straight while you use the belt or towel to pull your foot toward you. You should feel a gentle stretch behind your calf or knee in your gastroc muscle. Hold this position for __________ seconds, then release to the starting position. Repeat the exercise with your knee bent. You can put a pillow or a rolled bath towel under your knee to support it. You should feel a stretch deep in your calf in the soleus muscle or at your Achilles tendon. Repeat __________ times. Complete this exercise __________ times a day. Ankle alphabet  Sit with your left / right leg supported at the lower leg. Do not rest your foot on anything. Make sure your foot has room to move freely. Think of your left / right foot as a paintbrush. Move  your foot to trace each letter of the alphabet in the air. Keep your hip and knee still while you trace. Make the letters as large as you can without feeling discomfort. Trace every letter from A to Z. Repeat __________ times. Complete this exercise __________ times a day. Strengthening exercises These exercises build strength and endurance in your ankle and lower leg. Endurance is the ability to use your muscles for a long time, even after they get tired. Ankle dorsiflexion  Secure a rubber exercise band or tube to an object, such as a table leg, that will stay still when the band is pulled. Secure the other end around your left / right foot. Sit on the floor facing the object, with your left / right leg extended. The band or tube should be slightly tense when your foot is relaxed. Slowly bring your foot toward you, bringing the top of your foot toward your shin (dorsiflexion), and pulling the band tighter. Hold this position for __________ seconds. Slowly return your foot to the starting position. Repeat __________ times. Complete this exercise __________ times a day. Ankle plantar flexion  Sit on the floor with your left / right leg extended. Loop a rubber exercise tube or band around the ball of your left / right foot. The ball of your foot is on the walking surface, right under your toes. Hold the ends of the band or tube in your hands. The band or tube should be slightly  tense when your foot is relaxed. Slowly point your foot and toes downward to tilt the top of your foot away from your shin (plantar flexion). Hold this position for __________ seconds. Slowly return your foot to the starting position. Repeat __________ times. Complete this exercise __________ times a day. Ankle eversion Sit on the floor with your legs straight out in front of you. Loop a rubber exercise band or tube around the ball of your left / right foot. The ball of your foot is on the walking surface, right under  your toes. Hold the ends of the band in your hands, or secure the band to a stable object. The band or tube should be slightly tense when your foot is relaxed. Slowly push your foot outward, away from your other leg (eversion). Hold this position for __________ seconds. Slowly return your foot to the starting position. Repeat __________ times. Complete this exercise __________ times a day. This information is not intended to replace advice given to you by your health care provider. Make sure you discuss any questions you have with your health care provider. Document Revised: 08/04/2020 Document Reviewed: 08/04/2020 Elsevier Patient Education  [place]. Toe Fracture A toe fracture is a break in one of the toe bones (phalanges). This may happen if you: Drop a heavy object on your toe. Stub your toe. Twist your toe. Exercise the same way too much. What are the signs or symptoms? The main symptoms are swelling and pain in the toe. You may also have: Bruising. Stiffness. Numbness. A change in the way the toe looks. Broken bones that poke through the skin. Blood under the toenail. How is this treated? Treatments may include: Taping the broken toe to a toe that is next to it (buddy taping). Wearing a shoe that has a wide, rigid sole to protect the toe and to limit its movement. Wearing a cast. Surgery. This may be needed if the: Pieces of broken bone are out of place. Bone pokes through the skin. Physical therapy. Follow these instructions at home: If you have a shoe: Wear the shoe as told by your doctor. Remove it only as told by your doctor. Loosen the shoe if your toes tingle, become numb, or turn cold and blue. Keep the shoe clean and dry. If you have a cast: Do not put pressure on any part of the cast until it is fully hardened. This may take a few hours. Do not stick anything inside the cast to scratch your skin. Check the skin around the cast every day. Tell your  doctor about any concerns. You may put lotion on dry skin around the edges of the cast. Do not put lotion on the skin under the cast. Keep the cast clean and dry. Bathing Do not take baths, swim, or use a hot tub until your doctor says it is okay. Ask your doctor if you can take showers. If the shoe or cast is not waterproof: Do not let it get wet. Cover it with a watertight covering when you take a bath or a shower. Activity Do not use your foot to support your body weight until your doctor says it is okay. Use crutches as told by your doctor. Ask your doctor what activities are safe for you during recovery. Avoid activities as told by your doctor. Do exercises as told by your doctor or therapist. Driving Do not drive or use heavy machinery while taking pain medicine. Do not drive while wearing a cast  on a foot that you use for driving. Managing pain, stiffness, and swelling  Put ice on the injured area if told by your doctor: Put ice in a plastic bag. Place a towel between your skin and the bag. If you have a shoe, remove it as told by your doctor. If you have a cast, place a towel between your cast and the bag. Leave the ice on for 20 minutes, 2-3 times per day. Raise (elevate) the injured area above the level of your heart while you are sitting or lying down. General instructions If your toe was taped to a toe that is next to it, follow your doctor's instructions for changing the gauze and tape. Change it more often: If the gauze and tape get wet. If this happens, dry the space between the toes. If the gauze and tape are too tight and they cause your toe to become pale or to lose feeling (go numb). If your doctor did not give you a protective shoe, wear sturdy shoes that support your foot. Your shoes should not: Pinch your toes. Fit tightly against your toes. Do not use any tobacco products, including cigarettes, chewing tobacco, or e-cigarettes. These can delay bone healing. If  you need help quitting, ask your doctor. Take medicines only as told by your doctor. Keep all follow-up visits as told by your doctor. This is important. Contact a doctor if: Your pain medicine is not helping. You have a fever. You notice a bad smell coming from your cast. Get help right away if: You lose feeling (have numbness) in your toe or foot, and it is getting worse. Your toe or your foot tingles. Your toe or your foot gets cold or turns blue. You have redness or swelling in your toe or foot, and it is getting worse. You have very bad pain. Summary A toe fracture is a break in one of the toe bones. Use ice and raise your foot. This will help lessen pain and swelling. Use crutches as told by your doctor. This information is not intended to replace advice given to you by your health care provider. Make sure you discuss any questions you have with your health care provider. Document Revised: 11/14/2020 Document Reviewed: 11/14/2020 Elsevier Patient Education  [employer].

## 2021-11-30 ENCOUNTER — Other Ambulatory Visit: Payer: Self-pay | Admitting: Podiatry

## 2021-11-30 DIAGNOSIS — M722 Plantar fascial fibromatosis: Secondary | ICD-10-CM

## 2021-12-05 NOTE — Progress Notes (Signed)
Subjective: 51 year old female presents the office today for concerns of right foot injury.  She said this was the onset for the last week of April.  She states that a friend monitoring over her foot with a scooter.  She had constant pain to her second toe with range of motion.  She says it feels swollen but no redness that she reports.  No open lesions.  No recent treatment other than icing.  Objective: AAO x3, NAD DP/PT pulses palpable bilaterally, CRT less than 3 seconds There is mild tenderness palpation along the right second toe.  Minimal edema there is no erythema or warmth.  Toes are rectus.  Mild discomfort on the course of the lateral ankle complex along the ATFL but no gross ankle instability is present.  Minimal edema.  No erythema or warmth.  No area pinpoint tenderness.  No pain with ankle joint range of motion. No pain with calf compression, swelling, warmth, erythema  Assessment: 51 year old female ankle sprain, possible fracture second digit  Plan: -All treatment options discussed with the patient including all alternatives, risks, complications.  -X-rays were obtained reviewed.  3 views of the foot and ankle were obtained.  No evidence of fracture of the ankle.  There is likely old healing fracture noted on the second digit. -Recommend buddy splinting of the toes.  Discussed stretching, rehab exercises for the ankle sprain as well as wearing shoes and good arch support.  Anti-inflammatories as needed -Patient encouraged to call the office with any questions, concerns, change in symptoms.   Vivi Barrack DPM

## 2022-08-11 ENCOUNTER — Ambulatory Visit
Admission: EM | Admit: 2022-08-11 | Discharge: 2022-08-11 | Disposition: A | Discharge status: Home / Self Care | Payer: 59 | Attending: Urgent Care | Admitting: Urgent Care

## 2022-08-11 DIAGNOSIS — J069 Acute upper respiratory infection, unspecified: Secondary | ICD-10-CM | POA: Diagnosis not present

## 2022-08-11 MED ORDER — IPRATROPIUM BROMIDE 0.03 % NA SOLN: 1.0000 | Freq: Two times a day (BID) | NASAL | 0 refills | Status: AC

## 2022-08-11 NOTE — Discharge Instructions (Signed)
Follow up here or with your primary care provider if your symptoms are worsening or not improving.     

## 2022-08-11 NOTE — ED Provider Notes (Signed)
[facility]    CSN: [id number] Arrival date & time: 08/11/22  1109      History   Chief Complaint Chief Complaint  Patient presents with   Generalized Body Aches   Sore Throat   Cough    HPI Kathy Carlson is a 52 y.o. female.    Sore Throat  Cough   Patient presents to urgent care for cough and nasal congestion x 2 days. She also reports chills and body aches for the 2 days prior, now resolved.  Has been treating self with over-the-counter Tylenol Cold and flu. Also using ipratropium nasal spray for which she requests a refill.  Past Medical History:  Diagnosis Date   Allergy    Anxiety    Depression    IBS (irritable bowel syndrome)    Migraine     Patient Active Problem List   Diagnosis Date Noted   Plantar fasciitis 07/24/2019   Hav (hallux abducto valgus), left 04/30/2018   Onychomycosis 04/30/2018   Depressive disorder 04/29/2018   Pain in pelvis 04/29/2018   Vaginitis and vulvovaginitis 04/29/2018   Environmental allergies 10/07/2015   Migraine headache 10/07/2015    Past Surgical History:  Procedure Laterality Date   CARPAL TUNNEL RELEASE Right 01/31/2016   Procedure: CARPAL TUNNEL RELEASE;  Surgeon: Leanora Cover, MD;  Location: [facility];  Service: Orthopedics;  Laterality: Right;   NO PAST SURGERIES      OB History   No obstetric history on file.      Home Medications    Prior to Admission medications   Medication Sig Start Date End Date Taking? Authorizing Provider  cephALEXin (KEFLEX) 500 MG capsule Take 1 capsule (500 mg total) by mouth 3 (three) times daily. 08/11/19   Trula Slade, DPM  cyclobenzaprine (FLEXERIL) 10 MG tablet cyclobenzaprine 10 mg tablet 11/10/15   [provider]  estradiol (ESTRACE) 0.1 MG/GM vaginal cream INSERT 1 APPLICATOR(S)FUL EVERY DAY BY VAGINAL ROUTE FOR 7 DAYS. 09/02/19   [provider]  FLUoxetine (PROZAC) 10 MG tablet fluoxetine 10 mg tablet    [provider]  ipratropium (ATROVENT) 0.03 % nasal spray ipratropium bromide 0.03 % nasal spray    [provider]  meloxicam (MOBIC) 15 MG tablet Take 1 tablet (15 mg total) by mouth daily. 07/23/19   Trula Slade, DPM  meloxicam (MOBIC) 15 MG tablet TAKE 1 TABLET BY MOUTH EVERY DAY 01/26/20   Edrick Kins, DPM  metroNIDAZOLE (FLAGYL) 500 MG tablet Take 500 mg by mouth 2 (two) times daily. 08/11/19   [provider]  [place]  Onychomycosis Nail Lacquer -  Fluconazole 2%, Terbinafine 1% DMSO/undecylenic acid 25% Apply to affected nail once daily Qty. 120 gm 3 refills    [provider]  norethindrone-ethinyl estradiol (JINTELI) 1-5 MG-MCG TABS tablet Jinteli 1 mg-5 mcg tablet    [provider]  oxyCODONE-acetaminophen (PERCOCET/ROXICET) 5-325 MG tablet Take 1-2 tablets by mouth every 6 (six) hours as needed for severe pain. 09/03/19   Trula Slade, DPM  UNABLE TO FIND Compounded boric acid    [provider]    Family History Family History  Problem Relation Age of Onset   Thyroid disease Mother    Cancer Maternal Grandmother    Hypertension Maternal Grandmother    Thyroid disease Sister    Hypertension Maternal Grandfather    Diabetes Father     Social History Social History   Tobacco Use  Smoking status: Former   Smokeless tobacco: Never  Substance Use Topics   Alcohol use: Yes    Alcohol/week: 0.0 standard drinks of alcohol    Comment: every now and then   Drug use: No     Allergies   Other and Pollen extract   Review of Systems Review of Systems  Respiratory:  Positive for cough.      Physical Exam Triage Vital Signs ED Triage Vitals  Enc Vitals Group     BP      Pulse      Resp      Temp      Temp src      SpO2      Weight      Height      Head Circumference      Peak Flow      Pain Score      Pain Loc      Pain Edu?      Excl. in [place]?    No data found.  Updated  Vital Signs There were no vitals taken for this visit.  Visual Acuity Right Eye Distance:   Left Eye Distance:   Bilateral Distance:    Right Eye Near:   Left Eye Near:    Bilateral Near:     Physical Exam Vitals reviewed.  Constitutional:      Appearance: She is well-developed. She is not ill-appearing.  HENT:     Right Ear: Tympanic membrane normal.     Left Ear: Tympanic membrane normal.     Mouth/Throat:     Mouth: Mucous membranes are moist.     Pharynx: No oropharyngeal exudate or posterior oropharyngeal erythema.  Cardiovascular:     Rate and Rhythm: Normal rate and regular rhythm.     Heart sounds: Normal heart sounds.  Pulmonary:     Effort: Pulmonary effort is normal.     Breath sounds: Normal breath sounds.  Skin:    General: Skin is warm and dry.  Neurological:     General: No focal deficit present.     Mental Status: She is alert and oriented to person, place, and time.  Psychiatric:        Mood and Affect: Mood normal.        Behavior: Behavior normal.      UC Treatments / Results  Labs (all labs ordered are listed, but only abnormal results are displayed) Labs Reviewed - No data to display  EKG   Radiology No results found.  Procedures Procedures (including critical care time)  Medications Ordered in UC Medications - No data to display  Initial Impression / Assessment and Plan / UC Course  I have reviewed the triage vital signs and the nursing notes.  Pertinent labs & imaging results that were available during my care of the patient were reviewed by me and considered in my medical decision making (see chart for details).   Patient is afebrile here without recent antipyretics. Satting well on room air. Overall is well appearing, well hydrated, without respiratory distress. Pulmonary exam is unremarkable.  Lungs CTAB without wheezing, rhonchi, rales.  TMs WNL bilaterally.  Pharynx is not erythematous.  No peritonsillar exudates.  Patient's  symptoms are consistent with an acute viral process.  She is outside the treatment window for influenza and so continuing to recommend use of OTC medication for symptom control.  Ipratropium refill was provided.   Final Clinical Impressions(s) / UC Diagnoses   Final diagnoses:  None   Discharge Instructions   None    ED Prescriptions   None    PDMP not reviewed this encounter.   Rose Phi,  08/11/22 1158

## 2022-08-11 NOTE — ED Triage Notes (Signed)
Patient presents to UC for cough and nasal congestion x Thursday. Treating with tylenol cold flu.

## 2022-10-14 ENCOUNTER — Ambulatory Visit: Admit: 2022-10-14 | Discharge status: Against Medical Advice | Payer: 59

## 2023-03-27 ENCOUNTER — Other Ambulatory Visit: Payer: Self-pay | Admitting: Orthopedic Surgery

## 2023-06-10 ENCOUNTER — Other Ambulatory Visit: Payer: Self-pay

## 2023-06-10 ENCOUNTER — Encounter (HOSPITAL_BASED_OUTPATIENT_CLINIC_OR_DEPARTMENT_OTHER): Payer: Self-pay | Admitting: Orthopedic Surgery

## 2023-06-13 NOTE — Progress Notes (Signed)

## 2023-06-24 ENCOUNTER — Other Ambulatory Visit: Payer: Self-pay

## 2023-06-24 ENCOUNTER — Encounter (HOSPITAL_BASED_OUTPATIENT_CLINIC_OR_DEPARTMENT_OTHER): Payer: Self-pay | Admitting: Orthopedic Surgery

## 2023-06-24 ENCOUNTER — Other Ambulatory Visit (HOSPITAL_COMMUNITY): Payer: Self-pay

## 2023-06-24 ENCOUNTER — Ambulatory Visit (HOSPITAL_BASED_OUTPATIENT_CLINIC_OR_DEPARTMENT_OTHER)
Admission: RE | Admit: 2023-06-24 | Discharge: 2023-06-24 | Disposition: A | Discharge status: Home / Self Care | Payer: 59 | Source: Ambulatory Visit | Attending: Orthopedic Surgery | Admitting: Orthopedic Surgery

## 2023-06-24 ENCOUNTER — Encounter (HOSPITAL_BASED_OUTPATIENT_CLINIC_OR_DEPARTMENT_OTHER)
Admission: RE | Disposition: A | Discharge status: Home / Self Care | Payer: Self-pay | Source: Ambulatory Visit | Attending: Orthopedic Surgery

## 2023-06-24 ENCOUNTER — Ambulatory Visit (HOSPITAL_BASED_OUTPATIENT_CLINIC_OR_DEPARTMENT_OTHER): Payer: 59 | Admitting: Anesthesiology

## 2023-06-24 DIAGNOSIS — F418 Other specified anxiety disorders: Secondary | ICD-10-CM | POA: Insufficient documentation

## 2023-06-24 DIAGNOSIS — Z87891 Personal history of nicotine dependence: Secondary | ICD-10-CM | POA: Diagnosis not present

## 2023-06-24 DIAGNOSIS — G5602 Carpal tunnel syndrome, left upper limb: Secondary | ICD-10-CM

## 2023-06-24 DIAGNOSIS — Z6841 Body Mass Index (BMI) 40.0 and over, adult: Secondary | ICD-10-CM | POA: Diagnosis not present

## 2023-06-24 DIAGNOSIS — E66813 Obesity, class 3: Secondary | ICD-10-CM | POA: Insufficient documentation

## 2023-06-24 HISTORY — PX: CARPAL TUNNEL RELEASE: SHX101

## 2023-06-24 SURGERY — CARPAL TUNNEL RELEASE
Anesthesia: General | Site: Hand | Laterality: Left

## 2023-06-24 MED ORDER — ONDANSETRON HCL 4 MG/2ML IJ SOLN
INTRAMUSCULAR | Status: AC
  Filled 2023-06-24: qty 2

## 2023-06-24 MED ORDER — LACTATED RINGERS IV SOLN: INTRAVENOUS | Status: DC

## 2023-06-24 MED ORDER — DEXAMETHASONE SODIUM PHOSPHATE 10 MG/ML IJ SOLN
INTRAMUSCULAR | Status: DC | PRN
  Administered 2023-06-24: 5 mg via INTRAVENOUS

## 2023-06-24 MED ORDER — BUPIVACAINE HCL (PF) 0.25 % IJ SOLN
INTRAMUSCULAR | Status: DC | PRN
  Administered 2023-06-24: 9 mL

## 2023-06-24 MED ORDER — HYDROCODONE-ACETAMINOPHEN 5-325 MG PO TABS
1.0000 | ORAL_TABLET | Freq: Four times a day (QID) | ORAL | 0 refills | Status: AC | PRN
  Filled 2023-06-24: qty 15, 2d supply, fill #0

## 2023-06-24 MED ORDER — MIDAZOLAM HCL 2 MG/2ML IJ SOLN
INTRAMUSCULAR | Status: DC | PRN
  Administered 2023-06-24: 2 mg via INTRAVENOUS

## 2023-06-24 MED ORDER — PROPOFOL 10 MG/ML IV BOLUS
INTRAVENOUS | Status: AC
  Filled 2023-06-24: qty 20

## 2023-06-24 MED ORDER — FENTANYL CITRATE (PF) 100 MCG/2ML IJ SOLN: 25.0000 ug | INTRAMUSCULAR | Status: DC | PRN

## 2023-06-24 MED ORDER — 0.9 % SODIUM CHLORIDE (POUR BTL) OPTIME
TOPICAL | Status: DC | PRN
  Administered 2023-06-24: 1000 mL

## 2023-06-24 MED ORDER — ACETAMINOPHEN 500 MG PO TABS
1000.0000 mg | ORAL_TABLET | Freq: Once | ORAL | Status: AC
  Administered 2023-06-24: 1000 mg via ORAL

## 2023-06-24 MED ORDER — LIDOCAINE HCL (CARDIAC) PF 100 MG/5ML IV SOSY
PREFILLED_SYRINGE | INTRAVENOUS | Status: DC | PRN
  Administered 2023-06-24: 80 mg via INTRAVENOUS

## 2023-06-24 MED ORDER — ONDANSETRON HCL 4 MG/2ML IJ SOLN: 4.0000 mg | Freq: Once | INTRAMUSCULAR | Status: DC | PRN

## 2023-06-24 MED ORDER — ONDANSETRON HCL 4 MG/2ML IJ SOLN
INTRAMUSCULAR | Status: DC | PRN
  Administered 2023-06-24: 4 mg via INTRAVENOUS

## 2023-06-24 MED ORDER — PROPOFOL 10 MG/ML IV BOLUS
INTRAVENOUS | Status: DC | PRN
  Administered 2023-06-24: 170 mg via INTRAVENOUS

## 2023-06-24 MED ORDER — ACETAMINOPHEN 500 MG PO TABS
ORAL_TABLET | ORAL | Status: AC
  Filled 2023-06-24: qty 2

## 2023-06-24 MED ORDER — LACTATED RINGERS IV SOLN: INTRAVENOUS | Status: DC | PRN

## 2023-06-24 MED ORDER — FENTANYL CITRATE (PF) 100 MCG/2ML IJ SOLN
INTRAMUSCULAR | Status: DC | PRN
  Administered 2023-06-24: 50 ug via INTRAVENOUS

## 2023-06-24 MED ORDER — CEFAZOLIN SODIUM-DEXTROSE 2-4 GM/100ML-% IV SOLN
INTRAVENOUS | Status: AC
  Filled 2023-06-24: qty 100

## 2023-06-24 MED ORDER — DEXAMETHASONE SODIUM PHOSPHATE 10 MG/ML IJ SOLN
INTRAMUSCULAR | Status: AC
  Filled 2023-06-24: qty 1

## 2023-06-24 MED ORDER — MIDAZOLAM HCL 2 MG/2ML IJ SOLN
INTRAMUSCULAR | Status: AC
  Filled 2023-06-24: qty 2

## 2023-06-24 MED ORDER — AMISULPRIDE (ANTIEMETIC) 5 MG/2ML IV SOLN
10.0000 mg | Freq: Once | INTRAVENOUS | Status: DC | PRN
Start: 2023-06-24 — End: 2023-06-24

## 2023-06-24 MED ORDER — CEFAZOLIN SODIUM-DEXTROSE 2-4 GM/100ML-% IV SOLN: 2.0000 g | INTRAVENOUS | Status: DC

## 2023-06-24 MED ORDER — FENTANYL CITRATE (PF) 100 MCG/2ML IJ SOLN
INTRAMUSCULAR | Status: AC
  Filled 2023-06-24: qty 2

## 2023-06-24 SURGICAL SUPPLY — 31 items
BLADE SURG 15 STRL LF DISP TIS (BLADE) ×2 IMPLANT
BNDG ELASTIC 3INX 5YD STR LF (GAUZE/BANDAGES/DRESSINGS) ×1 IMPLANT
BNDG ESMARK 4X9 LF (GAUZE/BANDAGES/DRESSINGS) IMPLANT
BNDG GAUZE DERMACEA FLUFF 4 (GAUZE/BANDAGES/DRESSINGS) ×1 IMPLANT
CHLORAPREP W/TINT 26 (MISCELLANEOUS) ×1 IMPLANT
CORD BIPOLAR FORCEPS 12FT (ELECTRODE) ×1 IMPLANT
COVER BACK TABLE 60X90IN (DRAPES) ×1 IMPLANT
COVER MAYO STAND STRL (DRAPES) ×1 IMPLANT
CUFF TOURN SGL QUICK 18X4 (TOURNIQUET CUFF) ×1 IMPLANT
DRAPE EXTREMITY T 121X128X90 (DISPOSABLE) ×1 IMPLANT
DRAPE SURG 17X23 STRL (DRAPES) ×1 IMPLANT
GAUZE PAD ABD 8X10 STRL (GAUZE/BANDAGES/DRESSINGS) ×1 IMPLANT
GAUZE SPONGE 4X4 12PLY STRL (GAUZE/BANDAGES/DRESSINGS) ×1 IMPLANT
GAUZE XEROFORM 1X8 LF (GAUZE/BANDAGES/DRESSINGS) ×1 IMPLANT
GLOVE BIO SURGEON STRL SZ7.5 (GLOVE) ×1 IMPLANT
GLOVE BIOGEL PI IND STRL 7.0 (GLOVE) IMPLANT
GLOVE BIOGEL PI IND STRL 8 (GLOVE) ×1 IMPLANT
GLOVE SURG SS PI 7.0 STRL IVOR (GLOVE) IMPLANT
GOWN STRL REUS W/ TWL LRG LVL3 (GOWN DISPOSABLE) ×1 IMPLANT
GOWN STRL REUS W/TWL XL LVL3 (GOWN DISPOSABLE) ×1 IMPLANT
NDL HYPO 25X1 1.5 SAFETY (NEEDLE) ×1 IMPLANT
NEEDLE HYPO 25X1 1.5 SAFETY (NEEDLE) ×1 IMPLANT
NS IRRIG 1000ML POUR BTL (IV SOLUTION) ×1 IMPLANT
PACK BASIN DAY SURGERY FS (CUSTOM PROCEDURE TRAY) ×1 IMPLANT
PADDING CAST ABS COTTON 4X4 ST (CAST SUPPLIES) ×1 IMPLANT
STOCKINETTE 4X48 STRL (DRAPES) ×1 IMPLANT
SUT ETHILON 4 0 PS 2 18 (SUTURE) ×1 IMPLANT
SYR BULB EAR ULCER 3OZ GRN STR (SYRINGE) ×1 IMPLANT
SYR CONTROL 10ML LL (SYRINGE) ×1 IMPLANT
TOWEL GREEN STERILE FF (TOWEL DISPOSABLE) ×2 IMPLANT
UNDERPAD 30X36 HEAVY ABSORB (UNDERPADS AND DIAPERS) ×1 IMPLANT

## 2023-06-24 NOTE — Transfer of Care (Signed)
Immediate Anesthesia Transfer of Care Note  Patient: Lateesha Cumbie  Procedure(s) Performed: LEFT CARPAL TUNNEL RELEASE (Left: Hand)  Patient Location: PACU  Anesthesia Type:General  Level of Consciousness: awake, alert , and oriented  Airway & Oxygen Therapy: Patient Spontanous Breathing and Patient connected to face mask oxygen  Post-op Assessment: Report given to RN and Post -op Vital signs reviewed and stable  Post vital signs: Reviewed and stable  Last Vitals:  Vitals Value Taken Time  BP 116/89 06/24/23 1452  Temp    Pulse 78 06/24/23 1453  Resp    SpO2 100 % 06/24/23 1453  Vitals shown include unfiled device data.  Last Pain:  Vitals:   06/24/23 1234  TempSrc: Oral  PainSc: 0-No pain         Complications: No notable events documented.

## 2023-06-24 NOTE — Anesthesia Preprocedure Evaluation (Signed)
Anesthesia Evaluation  Patient identified by MRN, date of birth, ID band Patient awake    Reviewed: Allergy & Precautions, NPO status , Patient's Chart, lab work & pertinent test results  Airway Mallampati: II  TM Distance: >3 FB Neck ROM: Full    Dental  (+) Teeth Intact, Dental Advisory Given   Pulmonary former smoker   Pulmonary exam normal breath sounds clear to auscultation       Cardiovascular negative cardio ROS Normal cardiovascular exam Rhythm:Regular Rate:Normal     Neuro/Psych  Headaches PSYCHIATRIC DISORDERS Anxiety Depression       GI/Hepatic Neg liver ROS,,,IBS   Endo/Other    Class 3 obesity  Renal/GU negative Renal ROS     Musculoskeletal LEFT CARPAL TUNNEL SYNDROME   Abdominal   Peds  Hematology negative hematology ROS (+)   Anesthesia Other Findings Day of surgery medications reviewed with the patient.  Reproductive/Obstetrics                             Anesthesia Physical Anesthesia Plan  ASA: 3  Anesthesia Plan: General   Post-op Pain Management: Tylenol PO (pre-op)*   Induction: Intravenous  PONV Risk Score and Plan: 3 and Dexamethasone and Ondansetron  Airway Management Planned: LMA  Additional Equipment:   Intra-op Plan:   Post-operative Plan: Extubation in OR  Informed Consent: I have reviewed the patients History and Physical, chart, labs and discussed the procedure including the risks, benefits and alternatives for the proposed anesthesia with the patient or authorized representative who has indicated his/her understanding and acceptance.     Dental advisory given  Plan Discussed with: CRNA  Anesthesia Plan Comments:        Anesthesia Quick Evaluation

## 2023-06-24 NOTE — Anesthesia Procedure Notes (Signed)
Procedure Name: LMA Insertion Date/Time: 06/24/2023 2:32 PM  Performed by: Karen Kitchens, CRNAPre-anesthesia Checklist: Patient identified, Emergency Drugs available, Suction available and Patient being monitored Patient Re-evaluated:Patient Re-evaluated prior to induction Oxygen Delivery Method: Circle system utilized Preoxygenation: Pre-oxygenation with 100% oxygen Induction Type: IV induction Ventilation: Mask ventilation without difficulty LMA: LMA inserted LMA Size: 4.0 Number of attempts: 1 Airway Equipment and Method: Bite block Placement Confirmation: positive ETCO2, breath sounds checked- equal and bilateral and CO2 detector Tube secured with: Tape Dental Injury: Teeth and Oropharynx as per pre-operative assessment

## 2023-06-24 NOTE — Discharge Instructions (Addendum)

## 2023-06-24 NOTE — Op Note (Signed)
06/24/2023 [facility]                              OPERATIVE REPORT   PREOPERATIVE DIAGNOSIS:  Left carpal tunnel syndrome.  POSTOPERATIVE DIAGNOSIS:  Left carpal tunnel syndrome.  PROCEDURE:  Left carpal tunnel release.  SURGEON:  Betha Loa, MD  ASSISTANT:  none.  ANESTHESIA: General  IV FLUIDS:  Per anesthesia flow sheet.  ESTIMATED BLOOD LOSS:  Minimal.  COMPLICATIONS:  None.  SPECIMENS:  None.  TOURNIQUET TIME:    Total Tourniquet Time Documented: Forearm (Left) - 13 minutes Total: Forearm (Left) - 13 minutes   DISPOSITION:  Stable to PACU.  LOCATION: [facility]  INDICATIONS:  52 y.o. yo female with numbness and tingling left hand.  Positive nerve conduction studies. She wishes to proceed with left carpal tunnel release.  Risks, benefits and alternatives of surgery were discussed including the risk of blood loss; infection; damage to nerves, vessels, tendons, ligaments, bone; failure of surgery; need for additional surgery; complications with wound healing; continued pain; recurrence of carpal tunnel syndrome; and damage to motor branch. She voiced understanding of these risks and elected to proceed.   OPERATIVE COURSE:  After being identified preoperatively by myself, the patient and I agreed upon the procedure and site of procedure.  The surgical site was marked.  Surgical consent had been signed.  She was given IV Ancef as preoperative antibiotic prophylaxis.  She was transferred to the operating room and placed on the operating room table in supine position with the Left upper extremity on an armboard.  General anesthesia was induced by the anesthesiologist.  Left upper extremity was prepped and draped in normal sterile orthopaedic fashion.  A surgical pause was performed between the surgeons, anesthesia, and operating room staff, and all were in agreement as to the patient, procedure, and site of procedure.  Tourniquet at the proximal  aspect of the forearm was inflated to 250 mmHg after exsanguination of the arm with an Esmarch bandage  Incision was made over the transverse carpal ligament and carried into the subcutaneous tissues by spreading technique.  Bipolar electrocautery was used to obtain hemostasis.  The palmar fascia was sharply incised.  The transverse carpal ligament was identified.  The fascia distal to the ligament was opened.  Retractor was placed and the flexor tendons were identified.  The flexor tendon to the little finger was identified and retracted radially.  The transverse carpal ligament was then incised from distal to proximal under direct visualization.  Scissors were used to split the distal aspect of the volar antebrachial fascia.  A finger was placed into the wound to ensure complete decompression, which was the case.  The nerve was examined.  It was adherent to the radial leaflet.  The motor branch was identified and was intact.  The wound was copiously irrigated with sterile saline.  It was then closed with 4-0 nylon in a horizontal mattress fashion.  It was injected with 0.25% plain Marcaine to aid in postoperative analgesia.  It was dressed with sterile Xeroform, 4x4s, an ABD, and wrapped with Kerlix and an Ace bandage.  Tourniquet was deflated at 13 minutes.  Fingertips were pink with brisk capillary refill after deflation of the tourniquet.  Operative drapes were broken down.  The patient was awoken from anesthesia safely.  She was transferred back to stretcher and taken to the PACU in stable condition.  I will  see her back in the office in 1 week for postoperative followup.  I will give her a prescription for Norco 5/325 1-2 tabs PO q6 hours prn pain, dispense # 15.    Betha Loa, MD Electronically signed, 06/24/23

## 2023-06-24 NOTE — H&P (Signed)
Kathy Carlson is an 52 y.o. female.   Chief Complaint: carpal tunnel syndrome HPI: 52 y.o. yo female with numbness and tingling bilateral hand.  Positive nerve conduction studies. She wishes to have right carpal tunnel release.   Allergies:  Allergies  Allergen Reactions   Bee Pollen Other (See Comments)   Other    Pollen Extract     Past Medical History:  Diagnosis Date   Allergy    Anxiety    Depression    IBS (irritable bowel syndrome)    Migraine     Past Surgical History:  Procedure Laterality Date   BUNIONECTOMY Left    CARPAL TUNNEL RELEASE Right 01/31/2016   Procedure: CARPAL TUNNEL RELEASE;  Surgeon: Betha Loa, MD;  Location: [facility];  Service: Orthopedics;  Laterality: Right;   NO PAST SURGERIES      Family History: Family History  Problem Relation Age of Onset   Thyroid disease Mother    Cancer Maternal Grandmother    Hypertension Maternal Grandmother    Thyroid disease Sister    Hypertension Maternal Grandfather    Diabetes Father     Social History:   reports that she has quit smoking. She has never used smokeless tobacco. She reports current alcohol use. She reports that she does not use drugs.  Medications: Medications Prior to Admission  Medication Sig Dispense Refill   DULoxetine (CYMBALTA) 30 MG capsule Take 30 mg by mouth daily.     ipratropium (ATROVENT) 0.03 % nasal spray Place 1 spray into both nostrils 2 (two) times daily. 30 mL 0   cyclobenzaprine (FLEXERIL) 10 MG tablet cyclobenzaprine 10 mg tablet     estradiol (ESTRACE) 0.1 MG/GM vaginal cream INSERT 1 APPLICATOR(S)FUL EVERY DAY BY VAGINAL ROUTE FOR 7 DAYS.     meloxicam (MOBIC) 15 MG tablet Take 1 tablet (15 mg total) by mouth daily. 14 tablet 0   meloxicam (MOBIC) 15 MG tablet TAKE 1 TABLET BY MOUTH EVERY DAY 30 tablet 0   naproxen (NAPROSYN) 250 MG tablet Take by mouth 2 (two) times daily with a meal.     NON FORMULARY [facility]  Onychomycosis Nail  Lacquer -  Fluconazole 2%, Terbinafine 1% DMSO/undecylenic acid 25% Apply to affected nail once daily Qty. 120 gm 3 refills      No results found for this or any previous visit (from the past 48 hours).  No results found.    Blood pressure (!) 148/72, pulse 77, temperature 98.3 F (36.8 C), temperature source Oral, resp. rate 16, height 5\' 5"  (1.651 m), weight 115.7 kg, last menstrual period 01/22/2016, SpO2 100%.  General appearance: alert, cooperative, and appears stated age Head: Normocephalic, without obvious abnormality, atraumatic Neck: supple, symmetrical, trachea midline Extremities: Intact refill all digits.  +epl/fpl/io.  No wounds.  Pulses: 2+ and symmetric Skin: Skin color, texture, turgor normal. No rashes or lesions Neurologic: Grossly normal Incision/Wound: none  Assessment/Plan Right carpal tunnel syndrome.  Non operative and operative treatment options have been discussed with the patient and patient wishes to proceed with operative treatment. Risks, benefits and alternatives of surgery were discussed including risks of blood loss, infection, damage to nerves/vessels/tendons/ligament/bone, failure of surgery, need for additional surgery, complication with wound healing, stiffness, recurrence.  She voiced understanding of these risks and elected to proceed.    Betha Loa 06/24/2023, 1:18 PM

## 2023-06-24 NOTE — Anesthesia Postprocedure Evaluation (Signed)
Anesthesia Post Note  Patient: Electronics engineer  Procedure(s) Performed: LEFT CARPAL TUNNEL RELEASE (Left: Hand)     Patient location during evaluation: PACU Anesthesia Type: General Level of consciousness: awake and alert Pain management: pain level controlled Vital Signs Assessment: post-procedure vital signs reviewed and stable Respiratory status: spontaneous breathing, nonlabored ventilation and respiratory function stable Cardiovascular status: blood pressure returned to baseline and stable Postop Assessment: no apparent nausea or vomiting Anesthetic complications: no   No notable events documented.  Last Vitals:  Vitals:   06/24/23 1515 06/24/23 1523  BP: (!) 142/90 (!) 155/82  Pulse: 61 77  Resp: 14 16  Temp:  (!) 36.2 C  SpO2: 98% 99%    Last Pain:  Vitals:   06/24/23 1523  TempSrc:   PainSc: 0-No pain                 Collene Schlichter

## 2023-06-25 ENCOUNTER — Encounter (HOSPITAL_BASED_OUTPATIENT_CLINIC_OR_DEPARTMENT_OTHER): Payer: Self-pay | Admitting: Orthopedic Surgery

## 2023-12-09 IMAGING — MR MR LUMBAR SPINE W/O CM
4 of 5 series · 25 of 48 positions shown · non-contrast
Comparison: Prior radiograph from 10/05/2014.

CLINICAL DATA: Initial evaluation for lower back pain with right
lower extremity pain, weakness.

EXAM:
MRI LUMBAR SPINE WITHOUT CONTRAST
TECHNIQUE: Multiplanar, multisequence MR imaging of the lumbar spine was
performed. No intravenous contrast was administered.

[Series 3: T2 · sagittal · 4.0mm · 0.53mm/px · 6 of 17 slices shown (1 of 2)]
[im 1/17]
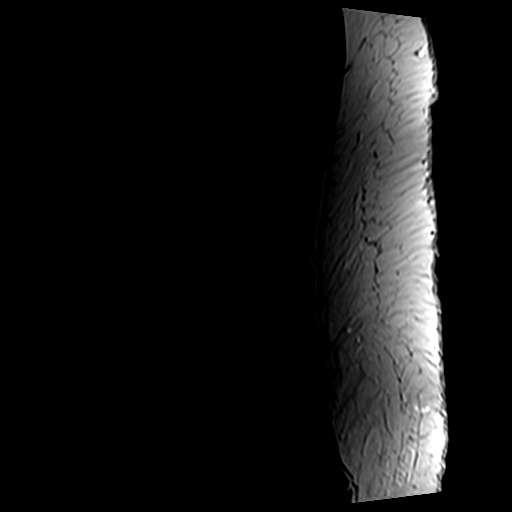
[im 4/17]
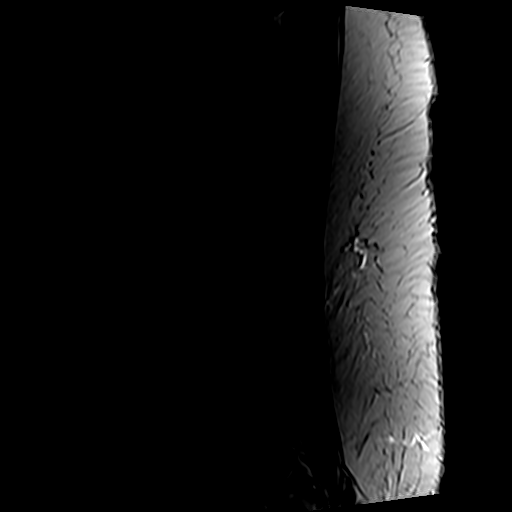
[im 7/17]
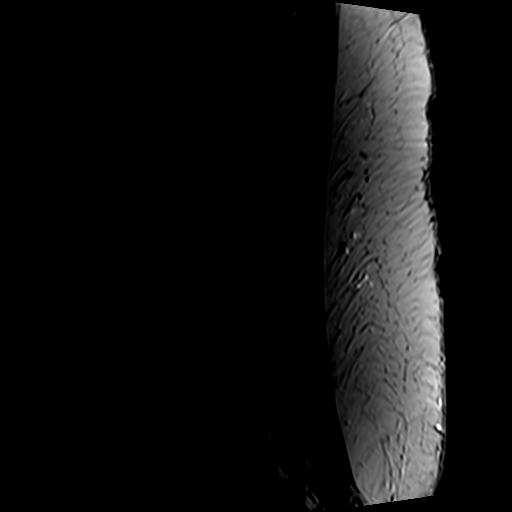
[im 10/17]
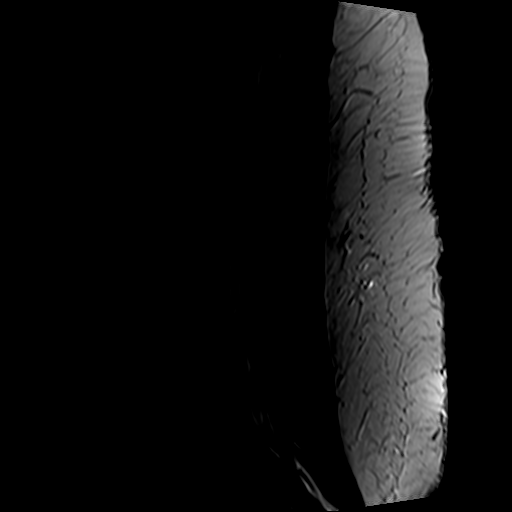
[im 13/17]
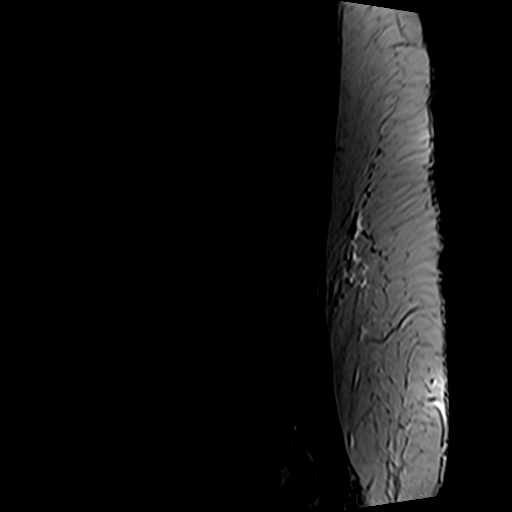
[im 17/17]
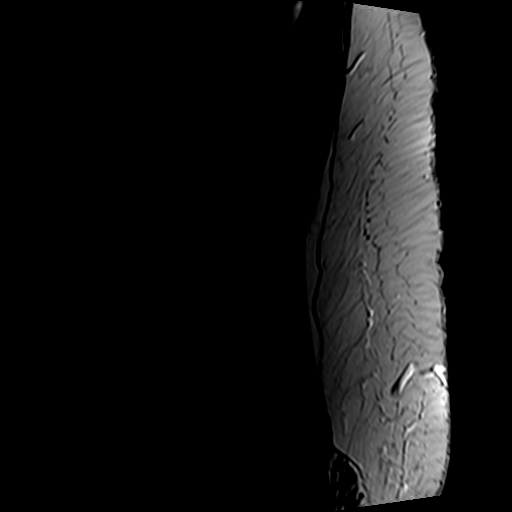

[Series 5: T1 · sagittal · 4.0mm · 0.53mm/px · 5 of 17 slices shown (1 of 2)]
[im 1/17]
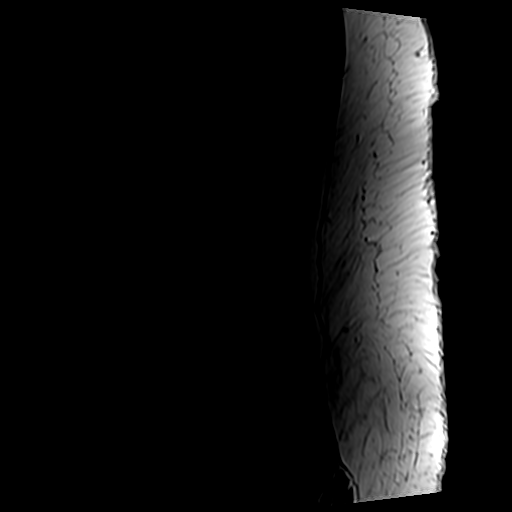
[im 5/17]
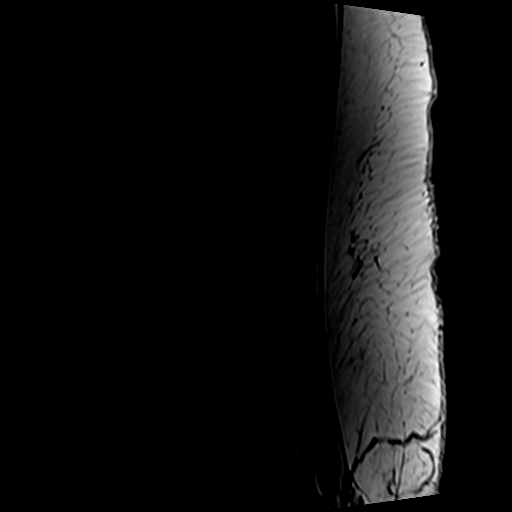
[im 9/17]
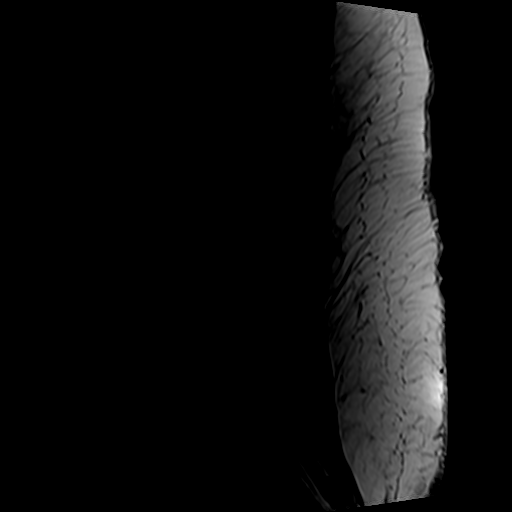
[im 13/17]
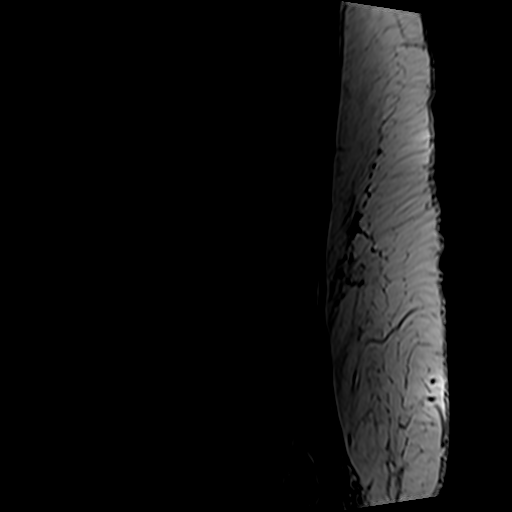
[im 17/17]
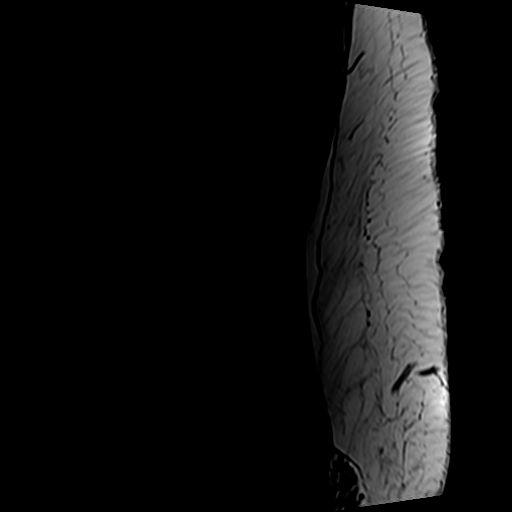

[Series 6: T2 · axial · 4.0mm · 0.78mm/px · z∈[-69,+175]mm · 10 of 49 slices shown (2 of 2)]
[im 4/49]
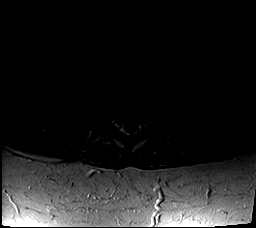
[im 7/49]
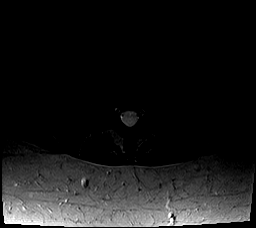
[im 10/49]
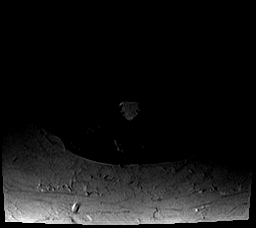
[im 17/49]
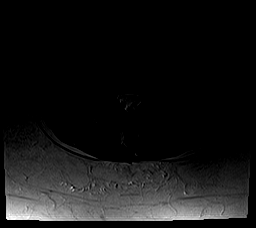
[im 23/49]
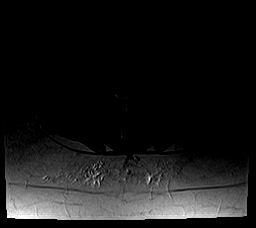
[im 26/49]
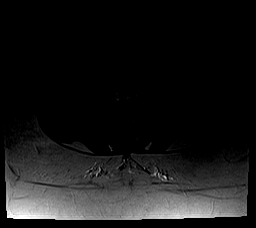
[im 29/49]
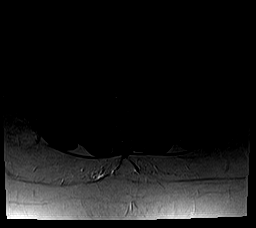
[im 36/49]
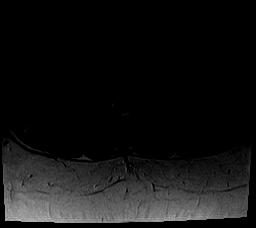
[im 42/49]
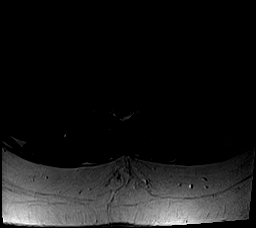
[im 49/49]
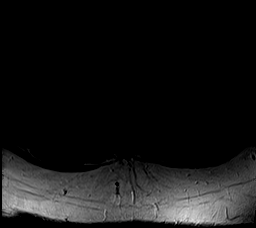

[Series 7: T1 · axial · 4.0mm · 0.39mm/px · z∈[-69,+138]mm · 4 of 49 slices shown (2 of 2)]
[im 4/49]
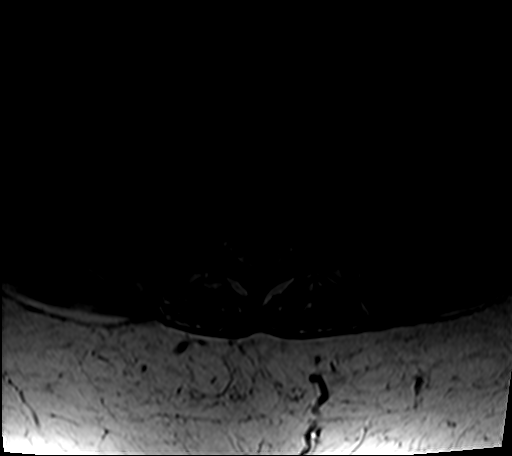
[im 7/49]
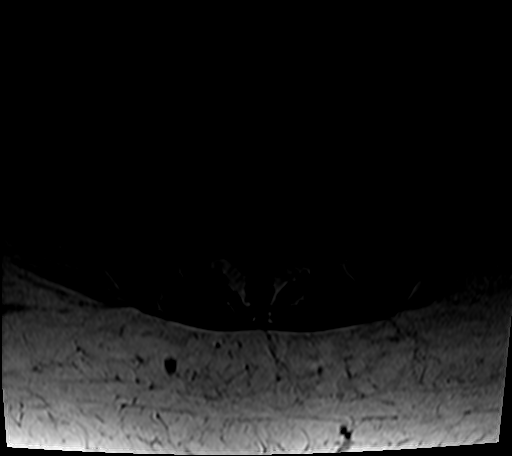
[im 26/49]
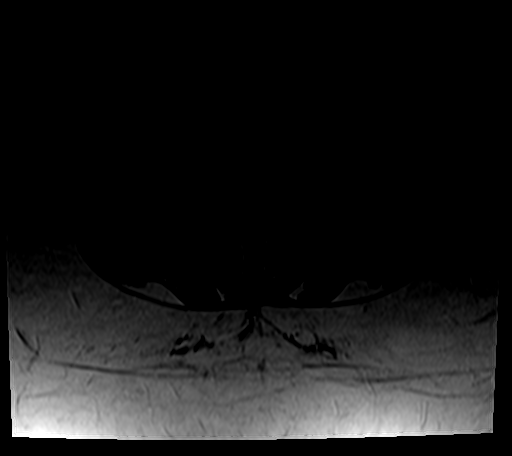
[im 42/49]
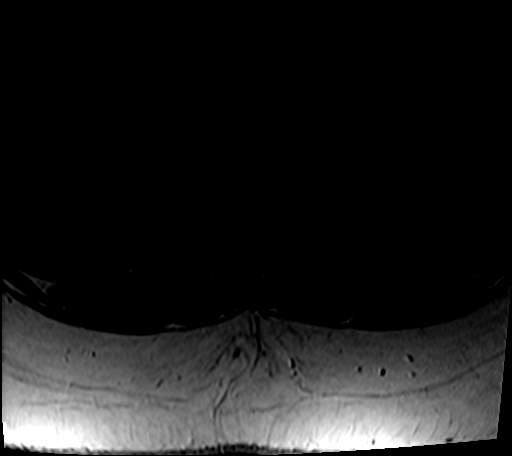

[25 of 48 positions shown; findings below may reference images not displayed]

FINDINGS: Segmentation: Standard. Lowest well-formed disc space labeled the
L5-S1 level.

Alignment: Trace anterolisthesis of L3 on L4, chronic and facet
mediated. Alignment otherwise normal with preservation of the normal
lumbar lordosis.

Vertebrae: Vertebral body height maintained without acute or chronic
fracture. Bone marrow signal intensity within normal limits. No
worrisome osseous lesions. Discogenic reactive endplate change with
marrow edema present about the L4-5 interspace, greater on the
right. No other abnormal marrow edema.

Conus medullaris and cauda equina: Conus extends to the L1-2 level.
Conus and cauda equina appear normal.

Paraspinal and other soft tissues: Unremarkable.

Disc levels:

T10-11: Disc desiccation with mild disc bulge and reactive endplate
spurring. Mild facet hypertrophy. No significant spinal stenosis.
Moderate left with mild right foraminal stenosis.

T11-12: Unremarkable.

T12-L1: Unremarkable.

L1-2:  Unremarkable.

L2-3: Disc desiccation with mild disc bulge. Superimposed small
biforaminal disc protrusions with annular fissures, potentially
affecting either of the exiting L2 nerve roots (series 6, image 28).
No significant spinal stenosis. Foramina remain patent.

L3-4: Trace anterolisthesis. Disc desiccation without significant
disc bulge. Subtle right extraforaminal disc protrusion closely
approximates the exiting right L3 nerve root without frank
impingement (series 6, image 33). Mild facet hypertrophy. No
significant spinal stenosis. Foramina remain patent.

L4-5: Degenerative intervertebral disc space narrowing with disc
desiccation and diffuse disc bulge. Superimposed small central to
right subarticular disc protrusion closely approximates and/or
contacts the descending right L5 nerve root (series 6, image 39).
Mild facet hypertrophy. Resultant mild narrowing of the right
lateral recess. Central canal remains patent. Mild right L4
foraminal stenosis. No significant left foraminal narrowing.

L5-S1: Negative interspace. Mild right worse than left facet
hypertrophy. No spinal stenosis. Foramina remain patent.
IMPRESSION: 1. Small central to right subarticular disc protrusion at L4-5,
potentially affecting the descending right L5 nerve root.
2. Subtle right extraforaminal disc protrusion at L3-4, closely
approximating and potentially irritating the exiting right L3 nerve
root.
3. Small biforaminal disc protrusions with annular fissures at L2-3,
potentially affecting either of the exiting L2 nerve roots.
4. Reactive endplate change with marrow edema about the L4-5
interspace, which could contribute to underlying back pain.

## 2024-01-24 ENCOUNTER — Ambulatory Visit: Payer: Self-pay

## 2024-01-28 ENCOUNTER — Ambulatory Visit

## 2024-03-20 ENCOUNTER — Telehealth: Admitting: Family Medicine

## 2024-03-20 ENCOUNTER — Ambulatory Visit: Payer: Self-pay

## 2024-03-20 DIAGNOSIS — J301 Allergic rhinitis due to pollen: Secondary | ICD-10-CM | POA: Diagnosis not present

## 2024-03-20 MED ORDER — PREDNISONE 20 MG PO TABS: 20.0000 mg | ORAL_TABLET | Freq: Two times a day (BID) | ORAL | 0 refills | Status: AC

## 2024-03-20 MED ORDER — PROMETHAZINE-DM 6.25-15 MG/5ML PO SYRP: 5.0000 mL | ORAL_SOLUTION | Freq: Four times a day (QID) | ORAL | 0 refills | Status: AC | PRN

## 2024-03-20 MED ORDER — MONTELUKAST SODIUM 10 MG PO TABS: 10.0000 mg | ORAL_TABLET | Freq: Every day | ORAL | 0 refills | Status: AC

## 2024-03-20 NOTE — Progress Notes (Signed)
 Virtual Visit Consent   Kathy Carlson, you are scheduled for a virtual visit with a Kathy Carlson provider today. Just as with appointments in the office, your consent must be obtained to participate. Your consent will be active for this visit and any virtual visit you may have with one of our providers in the next 365 days. If you have a MyChart account, a copy of this consent can be sent to you electronically.  As this is a virtual visit, video technology does not allow for your provider to perform a traditional examination. This may limit your provider's ability to fully assess your condition. If your provider identifies any concerns that need to be evaluated in person or the need to arrange testing (such as labs, EKG, etc.), we will make arrangements to do so. Although advances in technology are sophisticated, we cannot ensure that it will always work on either your end or our end. If the connection with a video visit is poor, the visit may have to be switched to a telephone visit. With either a video or telephone visit, we are not always able to ensure that we have a secure connection.  By engaging in this virtual visit, you consent to the provision of healthcare and authorize for your insurance to be billed (if applicable) for the services provided during this visit. Depending on your insurance coverage, you may receive a charge related to this service.  I need to obtain your verbal consent now. Are you willing to proceed with your visit today? Kathy Carlson has provided verbal consent on 03/20/2024 for a virtual visit (video or telephone). Kathy Lamp, FNP  Date: 03/20/2024 3:38 PM   Virtual Visit via Video Note   I, Kathy Carlson, connected with  Kathy Carlson  ([id number], 11-Sep-1970) on 03/20/24 at  3:30 PM EDT by a video-enabled telemedicine application and verified that I am speaking with the correct person using two identifiers.  Location: Patient: Virtual Visit Location Patient:  Home Provider: Virtual Visit Location Provider: Home Office   I discussed the limitations of evaluation and management by telemedicine and the availability of in person appointments. The patient expressed understanding and agreed to proceed.    History of Present Illness: Kathy Carlson is a 53 y.o. who identifies as a female who was assigned female at birth, and is being seen today for 2 days of cough stuffy nose, tickle in throat, allergies flaring, no fever or colored mucus.  Kathy Carlson  HPI: HPI  Problems:  Patient Active Problem List   Diagnosis Date Noted   Plantar fasciitis 07/24/2019   Hav (hallux abducto valgus), left 04/30/2018   Onychomycosis 04/30/2018   Depressive disorder 04/29/2018   Pain in pelvis 04/29/2018   Vaginitis and vulvovaginitis 04/29/2018   Environmental allergies 10/07/2015   Migraine headache 10/07/2015    Allergies:  Allergies  Allergen Reactions   Bee Pollen Other (See Comments)   Other    Pollen Extract    Medications:  Current Outpatient Medications:    montelukast  (SINGULAIR ) 10 MG tablet, Take 1 tablet (10 mg total) by mouth at bedtime., Disp: 30 tablet, Rfl: 0   predniSONE  (DELTASONE ) 20 MG tablet, Take 1 tablet (20 mg total) by mouth 2 (two) times daily with a meal for 5 days., Disp: 10 tablet, Rfl: 0   promethazine -dextromethorphan (PROMETHAZINE -DM) 6.25-15 MG/5ML syrup, Take 5 mLs by mouth 4 (four) times daily as needed for up to 10 days for cough., Disp: 118 mL, Rfl: 0   cyclobenzaprine  (FLEXERIL ) 10  MG tablet, cyclobenzaprine  10 mg tablet, Disp: , Rfl:    DULoxetine (CYMBALTA) 30 MG capsule, Take 30 mg by mouth daily., Disp: , Rfl:    estradiol (ESTRACE) 0.1 MG/GM vaginal cream, INSERT 1 APPLICATOR(S)FUL EVERY DAY BY VAGINAL ROUTE FOR 7 DAYS., Disp: , Rfl:    HYDROcodone -acetaminophen  (NORCO/VICODIN) 5-325 MG tablet, Take 1-2 tablets by mouth every 6 (six) hours as needed for pain., Disp: 15 tablet, Rfl: 0   ipratropium (ATROVENT ) 0.03 % nasal  spray, Place 1 spray into both nostrils 2 (two) times daily., Disp: 30 mL, Rfl: 0   meloxicam  (MOBIC ) 15 MG tablet, Take 1 tablet (15 mg total) by mouth daily., Disp: 14 tablet, Rfl: 0   meloxicam  (MOBIC ) 15 MG tablet, TAKE 1 TABLET BY MOUTH EVERY DAY, Disp: 30 tablet, Rfl: 0   naproxen  (NAPROSYN ) 250 MG tablet, Take by mouth 2 (two) times daily with a meal., Disp: , Rfl:    NON FORMULARY, [facility]  Onychomycosis Nail Lacquer -  Fluconazole 2%, Terbinafine 1% DMSO/undecylenic acid 25% Apply to affected nail once daily Qty. 120 gm 3 refills, Disp: , Rfl:   Observations/Objective: Patient is well-developed, well-nourished in no acute distress.  Resting comfortably  at home.  Head is normocephalic, atraumatic.  No labored breathing.  Speech is clear and coherent with logical content.  Patient is alert and oriented at baseline.    Assessment and Plan: 1. Seasonal allergic rhinitis due to pollen (Primary)  Continue zyrtec and nasal spray, UC as needed.   Follow Up Instructions: I discussed the assessment and treatment plan with the patient. The patient was provided an opportunity to ask questions and all were answered. The patient agreed with the plan and demonstrated an understanding of the instructions.  A copy of instructions were sent to the patient via MyChart unless otherwise noted below.     The patient was advised to call back or seek an in-person evaluation if the symptoms worsen or if the condition fails to improve as anticipated.    Kathy Pelley, FNP

## 2024-03-20 NOTE — Patient Instructions (Signed)
